# Patient Record
Sex: Female | Born: 1970 | Race: White | Hispanic: No | Marital: Married | Smoking: Never smoker
Health system: Southern US, Community
[De-identification: ages and names within clinical notes are randomized; demographics above are authoritative.]

## PROBLEM LIST (undated history)

## (undated) DIAGNOSIS — R112 Nausea with vomiting, unspecified: Secondary | ICD-10-CM

## (undated) DIAGNOSIS — Z9889 Other specified postprocedural states: Secondary | ICD-10-CM

## (undated) DIAGNOSIS — R7303 Prediabetes: Secondary | ICD-10-CM

## (undated) DIAGNOSIS — O24419 Gestational diabetes mellitus in pregnancy, unspecified control: Secondary | ICD-10-CM

## (undated) DIAGNOSIS — G4730 Sleep apnea, unspecified: Secondary | ICD-10-CM

## (undated) DIAGNOSIS — H8109 Meniere's disease, unspecified ear: Secondary | ICD-10-CM

## (undated) HISTORY — PX: CHOLECYSTECTOMY: SHX55

## (undated) HISTORY — DX: Prediabetes: R73.03

## (undated) HISTORY — PX: WISDOM TOOTH EXTRACTION: SHX21

## (undated) HISTORY — PX: UPPER GASTROINTESTINAL ENDOSCOPY: SHX188

## (undated) HISTORY — DX: Sleep apnea, unspecified: G47.30

## (undated) HISTORY — DX: Gestational diabetes mellitus in pregnancy, unspecified control: O24.419

---

## 1999-03-26 HISTORY — PX: BREAST EXCISIONAL BIOPSY: SUR124

## 1999-11-19 ENCOUNTER — Other Ambulatory Visit: Admission: RE | Admit: 1999-11-19 | Discharge: 1999-11-19 | Payer: Self-pay | Admitting: Gynecology

## 2000-10-28 ENCOUNTER — Other Ambulatory Visit: Admission: RE | Admit: 2000-10-28 | Discharge: 2000-10-28 | Payer: Self-pay | Admitting: Gynecology

## 2001-11-18 ENCOUNTER — Other Ambulatory Visit: Admission: RE | Admit: 2001-11-18 | Discharge: 2001-11-18 | Payer: Self-pay | Admitting: Obstetrics and Gynecology

## 2001-11-27 ENCOUNTER — Encounter: Admission: RE | Admit: 2001-11-27 | Discharge: 2001-11-27 | Payer: Self-pay | Admitting: Obstetrics and Gynecology

## 2001-11-27 ENCOUNTER — Encounter: Payer: Self-pay | Admitting: Obstetrics and Gynecology

## 2002-04-06 ENCOUNTER — Encounter: Admission: RE | Admit: 2002-04-06 | Discharge: 2002-04-06 | Payer: Self-pay | Admitting: Obstetrics and Gynecology

## 2002-05-17 ENCOUNTER — Inpatient Hospital Stay (HOSPITAL_COMMUNITY): Admission: AD | Admit: 2002-05-17 | Discharge: 2002-05-21 | Payer: Self-pay | Admitting: Obstetrics and Gynecology

## 2002-05-18 ENCOUNTER — Encounter (INDEPENDENT_AMBULATORY_CARE_PROVIDER_SITE_OTHER): Payer: Self-pay

## 2002-05-22 ENCOUNTER — Encounter: Admission: RE | Admit: 2002-05-22 | Discharge: 2002-06-21 | Payer: Self-pay | Admitting: Obstetrics and Gynecology

## 2002-06-28 ENCOUNTER — Other Ambulatory Visit: Admission: RE | Admit: 2002-06-28 | Discharge: 2002-06-28 | Payer: Self-pay | Admitting: Obstetrics and Gynecology

## 2003-07-19 ENCOUNTER — Other Ambulatory Visit: Admission: RE | Admit: 2003-07-19 | Discharge: 2003-07-19 | Payer: Self-pay | Admitting: Gynecology

## 2004-03-25 HISTORY — PX: CHOLECYSTECTOMY: SHX55

## 2004-07-19 ENCOUNTER — Other Ambulatory Visit: Admission: RE | Admit: 2004-07-19 | Discharge: 2004-07-19 | Payer: Self-pay | Admitting: Gynecology

## 2005-07-17 ENCOUNTER — Other Ambulatory Visit: Admission: RE | Admit: 2005-07-17 | Discharge: 2005-07-17 | Payer: Self-pay | Admitting: Gynecology

## 2006-03-25 HISTORY — PX: NASAL SEPTUM SURGERY: SHX37

## 2006-07-15 ENCOUNTER — Other Ambulatory Visit: Admission: RE | Admit: 2006-07-15 | Discharge: 2006-07-15 | Payer: Self-pay | Admitting: Gynecology

## 2007-07-16 ENCOUNTER — Other Ambulatory Visit: Admission: RE | Admit: 2007-07-16 | Discharge: 2007-07-16 | Payer: Self-pay | Admitting: Gynecology

## 2008-03-01 ENCOUNTER — Encounter: Admission: RE | Admit: 2008-03-01 | Discharge: 2008-03-01 | Payer: Self-pay | Admitting: Obstetrics and Gynecology

## 2008-03-25 ENCOUNTER — Inpatient Hospital Stay (HOSPITAL_COMMUNITY): Admission: AD | Admit: 2008-03-25 | Discharge: 2008-03-25 | Payer: Self-pay | Admitting: Obstetrics & Gynecology

## 2008-04-24 ENCOUNTER — Inpatient Hospital Stay (HOSPITAL_COMMUNITY): Admission: AD | Admit: 2008-04-24 | Discharge: 2008-04-27 | Payer: Self-pay | Admitting: Obstetrics and Gynecology

## 2008-04-30 ENCOUNTER — Inpatient Hospital Stay (HOSPITAL_COMMUNITY): Admission: AD | Admit: 2008-04-30 | Discharge: 2008-04-30 | Payer: Self-pay | Admitting: Obstetrics and Gynecology

## 2010-07-09 LAB — CBC
Hemoglobin: 11.9 g/dL — ABNORMAL LOW (ref 12.0–15.0)
Platelets: 172 10*3/uL (ref 150–400)
RDW: 18.5 % — ABNORMAL HIGH (ref 11.5–15.5)

## 2010-07-09 LAB — GLUCOSE, CAPILLARY: Glucose-Capillary: 91 mg/dL (ref 70–99)

## 2010-07-10 LAB — CBC
HCT: 33 % — ABNORMAL LOW (ref 36.0–46.0)
Hemoglobin: 11 g/dL — ABNORMAL LOW (ref 12.0–15.0)
Platelets: 133 10*3/uL — ABNORMAL LOW (ref 150–400)
WBC: 8.8 10*3/uL (ref 4.0–10.5)
WBC: 9.1 10*3/uL (ref 4.0–10.5)

## 2010-07-10 LAB — BLOOD GAS, ARTERIAL
Acid-base deficit: 2 mmol/L (ref 0.0–2.0)
Drawn by: 132
FIO2: 0.21 %
TCO2: 22.1 mmol/L (ref 0–100)
pCO2 arterial: 32.3 mmHg — ABNORMAL LOW (ref 35.0–45.0)

## 2010-07-10 LAB — COMPREHENSIVE METABOLIC PANEL
AST: 33 U/L (ref 0–37)
Albumin: 2.9 g/dL — ABNORMAL LOW (ref 3.5–5.2)
BUN: 15 mg/dL (ref 6–23)
Calcium: 8.8 mg/dL (ref 8.4–10.5)
Chloride: 110 mEq/L (ref 96–112)
Creatinine, Ser: 0.79 mg/dL (ref 0.4–1.2)
GFR calc Af Amer: >60 mL/min (ref >60)
GFR calc non Af Amer: >60 mL/min (ref >60)
Total Bilirubin: 0.4 mg/dL (ref 0.3–1.2)

## 2010-07-10 LAB — URIC ACID: Uric Acid, Serum: 5.4 mg/dL (ref 2.4–7.0)

## 2010-08-07 NOTE — Discharge Summary (Signed)
[name], [name]                ACCOUNT NO.:  [id number]   MEDICAL RECORD NO.:  [id number]          PATIENT TYPE:  INP   LOCATION:  9133                          [facility]ACILITY:  [facility]   PHYSICIAN:  Zelphia Cairo, MD    DATE OF BIRTH:  12/29/1970   DATE OF ADMISSION:  04/24/2008  DATE OF DISCHARGE:  04/27/2008                               DISCHARGE SUMMARY   ADMITTING DIAGNOSES:  1. Intrauterine pregnancy at 38-6/7th weeks' estimated gestational      age.  2. Spontaneous rupture of membranes.  3. Previous cesarean section.   DISCHARGE DIAGNOSES:  1. Status post low transverse cesarean section.  2. A viable female infant.   PROCEDURE:  Repeat low transverse cesarean section.   REASON FOR ADMISSION:  Please see written H and P.   HOSPITAL COURSE:  The patient is 40 year old, gravida 2, para 1, that  presented to [facility] at 38-6/7th weeks' estimated  gestational age with spontaneous onset of labor and spontaneous rupture  of membranes.  The patient had had a previous cesarean delivery and  desired repeat.  On admission, vital signs were stable.  Fetal heart  tones were reactive.  Contractions were noted to be every 3-5 minutes.  Cervix was examined and found to be 2 cm dilated with rupture of  membranes.  The patient was then transferred to the operating room where  spinal anesthesia was administered without difficulty.  A low transverse  incision was made with delivery of a viable female infant, weighing 8  pounds 4 ounces with Apgars of 9 at 1 minute and 9 at 5 minute.  The  patient tolerated the procedure well and was taken to the recovery room  in stable condition.  On postop day #1, the patient was without  complaint.  Vital signs were stable.  She was afebrile.  Abdomen was  soft.  Abdominal dressings noted to be clean, dry, and intact.  Laboratory findings revealed hemoglobin of 9.0 and platelet count  133,000.  On postoperative day #2, the patient was  without complaint.  Vital signs were stable.  She was afebrile.  Fundus was firm and  nontender.  Incision was clean, dry, and intact.  She is ambulating well  and tolerating a regular diet without complaints of nausea and vomiting.  Postoperative day #3, the patient was without complaint.  Vital signs  remained stable.  She was afebrile.  Fundus was firm and nontender.  Incision was clean, dry, and intact.  Staples removed and the patient  was later discharged home.   CONDITION ON DISCHARGE:  Stable.   DIET:  Regular as tolerated.   ACTIVITY:  No heavy lifting, no driving x2 weeks, and no vaginal entry.   FOLLOWUP:  The patient is to follow up in the office in 1-2 weeks for  incision check.  She is to call for temperature greater than 100  degrees, persistent nausea, vomiting, heavy vaginal bleeding and/or  redness, or drainage from the incisional site.   DISCHARGE MEDICATIONS:  1. Tylox #30, 1 p.o. every 4-6 hours p.r.n.  2. Motrin 600  mg every 6 hours.  3. Prenatal vitamins 1 p.o. daily.  4. Colace 100 p.o. daily p.r.n.      Julio Sicks, N.P.      Zelphia Cairo, MD  Electronically Signed    CC/MEDQ  D:  04/27/2008  T:  04/27/2008  Job:  [id number]

## 2010-08-07 NOTE — Op Note (Signed)
[name], [name]                ACCOUNT NO.:  [id number]   MEDICAL RECORD NO.:  [id number]          PATIENT TYPE:  INP   LOCATION:  9133                          [facility]   PHYSICIAN:  Michelle L. Grewal, M.D.DATE OF BIRTH:  02-26-1971   DATE OF PROCEDURE:  04/24/2008  DATE OF DISCHARGE:                               OPERATIVE REPORT   PREOPERATIVE DIAGNOSES:  Intrauterine pregnancy at 30-6/7, previous  cesarean section and labor.   POSTOPERATIVE DIAGNOSES:  Intrauterine pregnancy at 30-6/7, previous  cesarean section and labor.   PROCEDURE:  Repeat low transverse cesarean section.   SURGEON:  Michelle L. Vincente Poli, MD   ANESTHESIA:  Spinal.   Apgars 8 and 9, female infant delivered.   PROCEDURE IN DETAIL:  The patient was taken to the operating room.  She  was prepped and draped after spinal was placed.  Foley catheter was  inserted.  A low transverse incision was made, carried down to the  fascia.  Fascia was scored in the midline and extended laterally.  Rectus muscles were separated in the midline.  The peritoneum was  entered bluntly.  Peritoneal incision was then stretched.  The lower  uterine segment was identified.  The bladder flap was created sharply  and then digitally.  Bladder blade was then readjusted.  A low  transverse incision was made in the uterus.  Uterus was entered using a  hemostat.  The baby was then delivered in cephalic presentation quite  easily and was a female infant of Apgars 8 at 1 minute and 9 at 5  minutes.  The cord was clamped and cut.  The baby was handed to the  awaiting pediatrician.  Uterus was exteriorized.  It was cleared of all  clots and debris.  It was closed in 2 layers using 0 chromic in a  running locked stitch.  The uterus was returned to the abdomen.  Irrigation was performed.  Hemostasis was excellent.  The peritoneum and  rectus muscles were reapproximated using 0 Vicryl.  The fascia was  closed in a running stitch  using 0 Vicryl.  After irrigation of  subcutaneous layer, the skin was closed with staples.  All sponge, lap,  and instrument counts were correct x2.  The patient went to recovery  room in stable condition.      Michelle L. Vincente Poli, M.D.  Electronically Signed     MLG/MEDQ  D:  04/24/2008  T:  04/24/2008  Job:  [id number]

## 2010-08-10 NOTE — Discharge Summary (Signed)
NAMEKAMREN, HESKETT                          ACCOUNT NO.:  [id number]   MEDICAL RECORD NO.:  [id number]                   PATIENT TYPE:  INP   LOCATION:  9140                                 [facility]   PHYSICIAN:  Michelle L. Vincente Poli, M.D.            DATE OF BIRTH:  [birth year]   DATE OF ADMISSION:  05/17/2002  DATE OF DISCHARGE:  05/21/2002                                 DISCHARGE SUMMARY   ADMITTING DIAGNOSES:  1. Intrauterine pregnancy at 46 weeks estimated gestational age.  2. Induction of labor due to oligohydramnios.  3. Positive ANA titer.   DISCHARGE DIAGNOSES:  1. Status post low transverse cesarean section secondary to failure to     progress and chorioamnionitis.  2. Viable female infant.   PROCEDURE:  Primary low transverse cesarean section.   REASON FOR ADMISSION:  Please see dictated H&P.   HOSPITAL COURSE:  The patient is a 40 year old primigravida that was  admitted to [facility] for induction of labor due to  oligohydramnios.  The pregnancy had been complicated by oligohydramnios and  positive ANA titer.  The patient had been followed closely with serial  ultrasounds and the patient was taking a baby aspirin daily.  The patient  was admitted for Cytotec for cervical ripening followed by amniotomy and  Pitocin induction the following morning.  The patient did progress to 4-5 cm  dilated.  Despite adequate labor, the patient never progressed beyond 4-5  cm.  The patient also developed a fever consistent with chorioamnionitis.  IV antibiotics were started.  Decision was made to proceed with a cesarean  delivery.   The patient was taken to the operating room where epidural anesthesia was  dosed to an adequate level.  A low transverse incision was made with the  delivery of a viable female infant weighing 6 pounds 13 ounces with Apgars of  7 at one minute, 9 at five minutes.  Arterial cord pH was 7.26.  The patient  tolerated the procedure  well and was taken to the recovery room in stable  condition.   On postoperative day #1, vital signs were stable and the patient was  afebrile.  The patient had good return of bowel function.  Abdomen was soft.  Fundus was firm and nontender.  Abdominal dressing was noted to be clean,  dry, and intact.  Labs revealed hemoglobin 9.7; platelet count 148,000; wbc  count 15.6.  On postoperative day #2 temperature maximum was 100.5.  IV  antibiotics were continued.  Fundus was firm and nontender.  Abdominal  dressing was removed revealing an incision that was clean, dry, and intact.  CBC revealed hemoglobin of 8.7, platelet count 142,000; wbc count was down  to 8.3.  The patient was started on iron supplementation.  On postoperative  day #3 vital signs were stable.  The patient was afebrile x24 hours.  CBC  revealed hemoglobin of 8.2;  platelet count of 139,000; and wbc count down to  6.1.  Abdomen was soft, incision was clean, dry, and intact, staples  removed, and the patient was discharged home.   CONDITION ON DISCHARGE:  Good.   DIET:  Regular as tolerated.   ACTIVITY:  No heavy lifting, no vaginal entry, no driving x2 weeks.   FOLLOW-UP:  The patient is to follow up in the office in one to two weeks  for an incision check.  She is to call for temperature greater than 100  degrees, persistent nausea and vomiting, heaving vaginal bleeding, and/or  redness or drainage from the incisional site.   DISCHARGE MEDICATIONS:  1. Motrin 600 mg q.6h. p.r.n.  2. Tylox #30 one p.o. q.4-6h. p.r.n.  3. Prenatal vitamins one p.o. daily.  4. Colace one p.o. daily p.r.n.     Julio Sicks, N.P.                        Stann Mainland. Vincente Poli, M.D.    CC/MEDQ  D:  06/08/2002  T:  06/08/2002  Job:  [id number]

## 2010-08-10 NOTE — H&P (Signed)
   NAMEJANITA, CAMBEROS NO.:  [id number]   MEDICAL RECORD NO.:  [id number]                   PATIENT TYPE:   LOCATION:                                       FACILITY:   PHYSICIAN:  Tracie Harrier, M.D.              DATE OF BIRTH:   DATE OF ADMISSION:  05/17/2002  DATE OF DISCHARGE:                                HISTORY & PHYSICAL   HISTORY OF PRESENT ILLNESS:  The patient is a 40 year old female, gravida 1,  at 37-5/[redacted] weeks gestation.  The patient is admitted for induction of labor  due to oligohydramnios.  Her pregnancy was complicated by oligohydramnios  and a positive ANA (need for antibody titer).  She has been on baby aspirin  and has been followed very closely with serial ultrasounds.   Also, the patient was placed on an ADA diet for one elevated value during a  three-hour glucose tolerance test.  Her sugars have done very well during  her pregnancy on this ADA diet.  Her pregnancy has otherwise been  uneventful.  She is now admitted for induction.   OBSTETRICAL LABORATORY DATA:  Maternal blood type O+.  Rubella immune.  Elevated glucose tolerance test as mentioned in HPI.  Group B Streptococcus  negative.   PAST MEDICAL HISTORY:  None.   PAST SURGICAL HISTORY:  1. Wisdom teeth extraction.  2. Left breast biopsy, benign.   CURRENT MEDICATIONS:  1. Prenatal vitamins.  2. One baby aspirin per day.   ALLERGIES:  None known.   PHYSICAL EXAMINATION:  VITAL SIGNS:  Stable, blood pressure 118/78, fetal  heart tones 160s.  GENERAL APPEARANCE:  She is a well-developed, well-nourished female in no  acute distress.  HEENT:  Within normal limits.  NECK:  Supple without adenopathy or thyromegaly.  HEART:  Regular rate and rhythm without murmur, rub, or gallop.  LUNGS:  Clear to auscultation.  BREASTS:  Deferred.  ABDOMEN:  Gravid and nontender.  EXTREMITIES:  Grossly normal.  NEUROLOGIC:  Grossly normal.  DTRs 2+.  No clonus.  PELVIC:   Exam per Dineen Kid. Rana Snare, M.D., 1 cm, 50% effaced, vertex  presentation, and -3 station.   ADMISSION DIAGNOSES:  1. Intrauterine pregnancy at 37-5/7 weeks.  2. History of oligohydramnios.  3. History of positive antinuclear antibody.  4. Borderline gestational diabetes.   PLAN:  1. Serial induction of labor.  2. Attempt normal vaginal delivery.                                               Tracie Harrier, M.D.    REG/MEDQ  D:  05/17/2002  T:  05/17/2002  Job:  [id number]

## 2010-08-10 NOTE — Op Note (Signed)
[name], [name]                          ACCOUNT NO.:  [id number]   MEDICAL RECORD NO.:  [id number]                   PATIENT TYPE:  INP   LOCATION:  9140                                 [facility]ILITY[facility]   PHYSICIAN:  Dineen Kid. Rana Snare, M.D.                 DATE OF BIRTH:  May 14, 1970   DATE OF PROCEDURE:  05/17/2002  DATE OF DISCHARGE:                                 OPERATIVE REPORT   PREOPERATIVE DIAGNOSES:  1. Intrauterine pregnancy at 39 weeks.  2. Chorioamnionitis.  3. Failure to progress.   POSTOPERATIVE DIAGNOSES:  1. Intrauterine pregnancy at 39 weeks.  2. Chorioamnionitis.  3. Failure to progress.   PROCEDURE:  Primary low segment transverse cesarean section.   SURGEON:  Dineen Kid. Rana Snare, M.D.   ANESTHESIA:  Epidural.   ESTIMATED BLOOD LOSS:  1000 mL.   INDICATIONS:  The patient is a 40 year old G1 at 47 weeks.  She presents for  induction of labor due to oligohydramnios.  Throughout the pregnancy she  discovered she was positive ANA.  She has been on baby aspirin.  She had  closed surveillance with serial ultrasounds.  She had resolution of the  oligohydramnios to a low normal fluid level.  Because she was term, planned  induction of labor.  She was admitted for Cytotec cervical ripening followed  by amniotomy and induction the following morning.  She progressed to 4-5 cm  and despite adequate labor did not progress beyond 4-5 cm.  She also  developed fever consistent with chorioamnionitis and was begun on ampicillin  and gentamicin.  Because of failure to progress and amnionitis, decision was  made to proceed with primary low cervical transverse cesarean section.  Risks and benefits were discussed.  Informed consent was obtained.  The  findings at the time of surgery were a viable female infant in the  occipitoposterior presentation.  Apgars were 7 and 9.  Cord ph arterial is  current pending.  The weight is 6 pounds 13 ounces.   DESCRIPTION OF PROCEDURE:  After  adequate analgesia, the patient was placed  in the supine position with left lateral tilt.  She was sterilely prepped  and draped.  Bladder was sterilely drained with a Foley catheter.  A  Pfannenstiel skin incision was made two fingerbreadths above the pubic  symphysis taken down sharply to the fascia, incised transversely and  extended superiorly and inferiorly.  The bellies of the rectus muscle were  separated sharply in the midline.  The peritoneum was then entered sharply.  The bladder flap was created and was placed behind the bladder blade.  A low  segment myotomy incision was made down to the infant's vertex.  The infant's  vertex was delivered atraumatically.  The nares and pharynx were suctioned.  The infant was then delivered.  The cord was clamped, cut and the infant was  handed to the pediatrician for  resuscitation.  Cord blood was then obtained.  The placenta was then extracted manually.  The uterus was then exteriorized,  wiped clean with a dry lap.  The myotomy incision was closed in two layers,  the first being a running locking layer and the second being an imbricating  layer of 0 Monocryl suture.  Hemostasis was achieved with a figure-of-eight  of 0 Monocryl.  After adequate hemostasis was achieved, the uterus was  placed back into the peritoneal cavity.  A small anterior fundal fibroid was  noted in the subserosal position, approximately 1 cm in size.  Normal tubes  and ovaries were also noted.  After a copious amount of irrigation and  adequate hemostasis, the peritoneum was closed with 0 Monocryl.  Rectus  muscle was plicated in the midline.  Irrigation was applied and after  adequate hemostasis, the fascia was closed with #1 Vicryl in a running  fashion.  Irrigation was once again applied.  After adequate hemostasis the  skin was stapled and Steri-Strips applied.  The patient tolerated the  procedure well and was stable on transfer to the recovery room.  Sponge and   instrument count was correct x3.  Estimated blood loss was 1000 mL.  The  patient received Ampicillin and gentamicin 2-1/2 hours prior to the surgery  for chorioamnionitis.  We will begin her on gentamicin and clindamycin after  the surgery and continue for 24 hours.                                               Dineen Kid Rana Snare, M.D.    DCL/MEDQ  D:  05/18/2002  T:  05/19/2002  Job:  [id number]

## 2012-01-20 DIAGNOSIS — G43109 Migraine with aura, not intractable, without status migrainosus: Secondary | ICD-10-CM | POA: Insufficient documentation

## 2012-01-20 DIAGNOSIS — F419 Anxiety disorder, unspecified: Secondary | ICD-10-CM

## 2012-01-20 DIAGNOSIS — H9319 Tinnitus, unspecified ear: Secondary | ICD-10-CM

## 2012-01-20 DIAGNOSIS — H8109 Meniere's disease, unspecified ear: Secondary | ICD-10-CM | POA: Insufficient documentation

## 2012-01-20 DIAGNOSIS — R42 Dizziness and giddiness: Secondary | ICD-10-CM

## 2012-01-20 HISTORY — DX: Dizziness and giddiness: R42

## 2012-01-20 HISTORY — DX: Tinnitus, unspecified ear: H93.19

## 2012-01-20 HISTORY — DX: Anxiety disorder, unspecified: F41.9

## 2012-11-06 DIAGNOSIS — R7303 Prediabetes: Secondary | ICD-10-CM | POA: Insufficient documentation

## 2014-10-14 ENCOUNTER — Ambulatory Visit (HOSPITAL_COMMUNITY)
Admission: RE | Admit: 2014-10-14 | Discharge: 2014-10-14 | Disposition: A | Discharge status: Home / Self Care | Payer: BLUE CROSS/BLUE SHIELD | Source: Ambulatory Visit | Attending: Obstetrics and Gynecology | Admitting: Obstetrics and Gynecology

## 2014-10-14 ENCOUNTER — Encounter (HOSPITAL_COMMUNITY): Payer: Self-pay

## 2014-10-14 ENCOUNTER — Ambulatory Visit (HOSPITAL_COMMUNITY): Payer: BLUE CROSS/BLUE SHIELD

## 2014-10-14 ENCOUNTER — Encounter (HOSPITAL_COMMUNITY)
Admission: RE | Disposition: A | Discharge status: Home / Self Care | Payer: Self-pay | Source: Ambulatory Visit | Attending: Obstetrics and Gynecology

## 2014-10-14 DIAGNOSIS — O99211 Obesity complicating pregnancy, first trimester: Secondary | ICD-10-CM | POA: Diagnosis not present

## 2014-10-14 DIAGNOSIS — Z3A01 Less than 8 weeks gestation of pregnancy: Secondary | ICD-10-CM | POA: Insufficient documentation

## 2014-10-14 DIAGNOSIS — Z332 Encounter for elective termination of pregnancy: Secondary | ICD-10-CM | POA: Insufficient documentation

## 2014-10-14 DIAGNOSIS — Z91013 Allergy to seafood: Secondary | ICD-10-CM | POA: Diagnosis not present

## 2014-10-14 DIAGNOSIS — O30011 Twin pregnancy, monochorionic/monoamniotic, first trimester: Secondary | ICD-10-CM | POA: Insufficient documentation

## 2014-10-14 DIAGNOSIS — Z6841 Body Mass Index (BMI) 40.0 and over, adult: Secondary | ICD-10-CM | POA: Diagnosis not present

## 2014-10-14 DIAGNOSIS — Z9049 Acquired absence of other specified parts of digestive tract: Secondary | ICD-10-CM | POA: Diagnosis not present

## 2014-10-14 DIAGNOSIS — O09521 Supervision of elderly multigravida, first trimester: Secondary | ICD-10-CM | POA: Diagnosis not present

## 2014-10-14 HISTORY — DX: Other specified postprocedural states: Z98.890

## 2014-10-14 HISTORY — DX: Other specified postprocedural states: R11.2

## 2014-10-14 HISTORY — PX: DILATION AND EVACUATION: SHX1459

## 2014-10-14 LAB — CBC
HCT: 39.9 % (ref 36.0–46.0)
HEMOGLOBIN: 13.5 g/dL (ref 12.0–15.0)
MCH: 27.4 pg (ref 26.0–34.0)
MCHC: 33.8 g/dL (ref 30.0–36.0)
MCV: 80.9 fL (ref 78.0–100.0)
PLATELETS: 234 10*3/uL (ref 150–400)
RBC: 4.93 MIL/uL (ref 3.87–5.11)
RDW: 14.7 % (ref 11.5–15.5)
WBC: 8.6 10*3/uL (ref 4.0–10.5)

## 2014-10-14 LAB — TYPE AND SCREEN
ABO/RH(D): O POS
Antibody Screen: NEGATIVE

## 2014-10-14 LAB — ABO/RH: ABO/RH(D): O POS

## 2014-10-14 SURGERY — DILATION AND EVACUATION, UTERUS
Anesthesia: General

## 2014-10-14 MED ORDER — MIDAZOLAM HCL 2 MG/2ML IJ SOLN
INTRAMUSCULAR | Status: AC
  Filled 2014-10-14: qty 2

## 2014-10-14 MED ORDER — ONDANSETRON HCL 4 MG/2ML IJ SOLN: 4.0000 mg | Freq: Once | INTRAMUSCULAR | Status: DC | PRN

## 2014-10-14 MED ORDER — LIDOCAINE HCL (CARDIAC) 20 MG/ML IV SOLN
INTRAVENOUS | Status: DC | PRN
  Administered 2014-10-14: 30 mg via INTRAVENOUS

## 2014-10-14 MED ORDER — SCOPOLAMINE 1 MG/3DAYS TD PT72
1.0000 | MEDICATED_PATCH | Freq: Once | TRANSDERMAL | Status: DC
  Administered 2014-10-14: 1.5 mg via TRANSDERMAL

## 2014-10-14 MED ORDER — FENTANYL CITRATE (PF) 100 MCG/2ML IJ SOLN: 25.0000 ug | INTRAMUSCULAR | Status: DC | PRN

## 2014-10-14 MED ORDER — METHYLERGONOVINE MALEATE 0.2 MG/ML IJ SOLN
INTRAMUSCULAR | Status: AC
  Filled 2014-10-14: qty 1

## 2014-10-14 MED ORDER — LACTATED RINGERS IV SOLN
INTRAVENOUS | Status: DC
  Administered 2014-10-14 (×2): via INTRAVENOUS

## 2014-10-14 MED ORDER — FENTANYL CITRATE (PF) 100 MCG/2ML IJ SOLN
INTRAMUSCULAR | Status: AC
  Filled 2014-10-14: qty 2

## 2014-10-14 MED ORDER — FENTANYL CITRATE (PF) 100 MCG/2ML IJ SOLN
INTRAMUSCULAR | Status: DC | PRN
  Administered 2014-10-14: 50 ug via INTRAVENOUS
  Administered 2014-10-14: 100 ug via INTRAVENOUS
  Administered 2014-10-14: 50 ug via INTRAVENOUS

## 2014-10-14 MED ORDER — MEPERIDINE HCL 25 MG/ML IJ SOLN: 6.2500 mg | INTRAMUSCULAR | Status: DC | PRN

## 2014-10-14 MED ORDER — DEXAMETHASONE SODIUM PHOSPHATE 10 MG/ML IJ SOLN
INTRAMUSCULAR | Status: DC | PRN
  Administered 2014-10-14: 4 mg via INTRAVENOUS

## 2014-10-14 MED ORDER — PROPOFOL 10 MG/ML IV BOLUS
INTRAVENOUS | Status: AC
  Filled 2014-10-14: qty 20

## 2014-10-14 MED ORDER — LIDOCAINE-EPINEPHRINE 1 %-1:100000 IJ SOLN
INTRAMUSCULAR | Status: AC
  Filled 2014-10-14: qty 1

## 2014-10-14 MED ORDER — PROPOFOL 500 MG/50ML IV EMUL
INTRAVENOUS | Status: DC | PRN
  Administered 2014-10-14: 200 mg via INTRAVENOUS

## 2014-10-14 MED ORDER — MIDAZOLAM HCL 2 MG/2ML IJ SOLN
INTRAMUSCULAR | Status: DC | PRN
  Administered 2014-10-14: 2 mg via INTRAVENOUS

## 2014-10-14 MED ORDER — KETOROLAC TROMETHAMINE 30 MG/ML IJ SOLN
INTRAMUSCULAR | Status: AC
  Filled 2014-10-14: qty 1

## 2014-10-14 MED ORDER — METHYLERGONOVINE MALEATE 0.2 MG/ML IJ SOLN
INTRAMUSCULAR | Status: DC | PRN
  Administered 2014-10-14: 0.2 mg via INTRAMUSCULAR

## 2014-10-14 MED ORDER — SCOPOLAMINE 1 MG/3DAYS TD PT72
MEDICATED_PATCH | TRANSDERMAL | Status: AC
  Administered 2014-10-14: 1.5 mg via TRANSDERMAL
  Filled 2014-10-14: qty 1

## 2014-10-14 MED ORDER — KETOROLAC TROMETHAMINE 30 MG/ML IJ SOLN
INTRAMUSCULAR | Status: DC | PRN
  Administered 2014-10-14: 30 mg via INTRAVENOUS

## 2014-10-14 MED ORDER — KETOROLAC TROMETHAMINE 30 MG/ML IJ SOLN: 30.0000 mg | Freq: Once | INTRAMUSCULAR | Status: DC | PRN

## 2014-10-14 MED ORDER — ONDANSETRON HCL 4 MG/2ML IJ SOLN
INTRAMUSCULAR | Status: DC | PRN
  Administered 2014-10-14: 4 mg via INTRAVENOUS

## 2014-10-14 MED ORDER — ALPRAZOLAM 0.5 MG PO TABS: 0.5000 mg | ORAL_TABLET | Freq: Three times a day (TID) | ORAL | Status: DC | PRN

## 2014-10-14 MED ORDER — DEXAMETHASONE SODIUM PHOSPHATE 4 MG/ML IJ SOLN
INTRAMUSCULAR | Status: AC
  Filled 2014-10-14: qty 1

## 2014-10-14 MED ORDER — ONDANSETRON HCL 4 MG/2ML IJ SOLN
INTRAMUSCULAR | Status: AC
  Filled 2014-10-14: qty 2

## 2014-10-14 MED ORDER — 0.9 % SODIUM CHLORIDE (POUR BTL) OPTIME
TOPICAL | Status: DC | PRN
  Administered 2014-10-14: 1000 mL

## 2014-10-14 MED ORDER — LIDOCAINE HCL (CARDIAC) 20 MG/ML IV SOLN
INTRAVENOUS | Status: AC
  Filled 2014-10-14: qty 5

## 2014-10-14 MED ORDER — CEFOTETAN DISODIUM 2 G IJ SOLR
2.0000 g | INTRAMUSCULAR | Status: AC
  Administered 2014-10-14: 2 g via INTRAVENOUS
  Filled 2014-10-14: qty 2

## 2014-10-14 SURGICAL SUPPLY — 17 items
CATH ROBINSON RED A/P 16FR (CATHETERS) ×2 IMPLANT
CLOTH BEACON ORANGE TIMEOUT ST (SAFETY) ×2 IMPLANT
DECANTER SPIKE VIAL GLASS SM (MISCELLANEOUS) ×2 IMPLANT
GLOVE BIO SURGEON STRL SZ8 (GLOVE) ×2 IMPLANT
GLOVE SURG ORTHO 8.0 STRL STRW (GLOVE) ×2 IMPLANT
GOWN STRL REUS W/TWL LRG LVL3 (GOWN DISPOSABLE) ×4 IMPLANT
KIT BERKELEY 1ST TRIMESTER 3/8 (MISCELLANEOUS) ×2 IMPLANT
NS IRRIG 1000ML POUR BTL (IV SOLUTION) ×2 IMPLANT
PACK VAGINAL MINOR WOMEN LF (CUSTOM PROCEDURE TRAY) ×2 IMPLANT
PAD OB MATERNITY 4.3X12.25 (PERSONAL CARE ITEMS) ×2 IMPLANT
PAD PREP 24X48 CUFFED NSTRL (MISCELLANEOUS) ×2 IMPLANT
SET BERKELEY SUCTION TUBING (SUCTIONS) ×2 IMPLANT
TOWEL OR 17X24 6PK STRL BLUE (TOWEL DISPOSABLE) ×4 IMPLANT
VACURETTE 10 RIGID CVD (CANNULA) IMPLANT
VACURETTE 7MM CVD STRL WRAP (CANNULA) IMPLANT
VACURETTE 8 RIGID CVD (CANNULA) ×1 IMPLANT
VACURETTE 9 RIGID CVD (CANNULA) IMPLANT

## 2014-10-14 NOTE — Brief Op Note (Signed)
10/14/2014  4:38 PM  PATIENT:  Mila Merry  44 y.o. female  PRE-OPERATIVE DIAGNOSIS:  elective /twins  POST-OPERATIVE DIAGNOSIS:  elective /twins  PROCEDURE:  Procedure(s): DILATATION AND EVACUATION (N/A)  SURGEON:  Surgeon(s) and Role:    * Candice Camp, MD - Primary  PHYSICIAN ASSISTANT:   ASSISTANTS: none   ANESTHESIA:   general  EBL:  Total I/O In: -  Out: 100 [Urine:100]  BLOOD ADMINISTERED:none  DRAINS: none   LOCAL MEDICATIONS USED:  NONE  SPECIMEN:  Source of Specimen:  POC  DISPOSITION OF SPECIMEN:  PATHOLOGY  COUNTS:  YES  TOURNIQUET:  * No tourniquets in log *  DICTATION: .Other Dictation: Dictation Number 1  PLAN OF CARE: Discharge to home after PACU  PATIENT DISPOSITION:  PACU - hemodynamically stable.   Delay start of Pharmacological VTE agent (>24hrs) due to surgical blood loss or risk of bleeding: no

## 2014-10-14 NOTE — Anesthesia Preprocedure Evaluation (Signed)
Anesthesia Evaluation  Patient identified by MRN, date of birth, ID band Patient awake    Reviewed: Allergy & Precautions, H&P , NPO status , Patient's Chart, lab work & pertinent test results  History of Anesthesia Complications (+) PONV  Airway Mallampati: II  TM Distance: >3 FB Neck ROM: full    Dental no notable dental hx.    Pulmonary neg pulmonary ROS,    Pulmonary exam normal       Cardiovascular negative cardio ROS Normal cardiovascular exam    Neuro/Psych negative neurological ROS  negative psych ROS   GI/Hepatic negative GI ROS, Neg liver ROS,   Endo/Other  Morbid obesity  Renal/GU negative Renal ROS     Musculoskeletal   Abdominal (+) + obese,   Peds  Hematology negative hematology ROS (+)   Anesthesia Other Findings   Reproductive/Obstetrics (+) Pregnancy                             Anesthesia Physical Anesthesia Plan  ASA: III  Anesthesia Plan: General   Post-op Pain Management:    Induction: Intravenous  Airway Management Planned: LMA  Additional Equipment:   Intra-op Plan:   Post-operative Plan:   Informed Consent: I have reviewed the patients History and Physical, chart, labs and discussed the procedure including the risks, benefits and alternatives for the proposed anesthesia with the patient or authorized representative who has indicated his/her understanding and acceptance.     Plan Discussed with: CRNA and Surgeon  Anesthesia Plan Comments:         Anesthesia Quick Evaluation

## 2014-10-14 NOTE — Transfer of Care (Signed)
Immediate Anesthesia Transfer of Care Note  Patient: [name]  Procedure(s) Performed: Procedure(s): DILATATION AND EVACUATION (N/A)  Patient Location: PACU  Anesthesia Type:General  Level of Consciousness: awake, alert , oriented and patient cooperative  Airway & Oxygen Therapy: Patient Spontanous Breathing  Post-op Assessment: Report given to RN and Post -op Vital signs reviewed and stable  Post vital signs: Reviewed and stable  Last Vitals:  Filed Vitals:   10/14/14 1648  BP:   Pulse:   Temp: 36.9 C  Resp:     Complications: No apparent anesthesia complications

## 2014-10-14 NOTE — Anesthesia Postprocedure Evaluation (Signed)
Anesthesia Post Note  Patient: [name]  Procedure(s) Performed: Procedure(s) (LRB): DILATATION AND EVACUATION (N/A)  Anesthesia type: General  Patient location: PACU  Post pain: Pain level controlled  Post assessment: Post-op Vital signs reviewed  Last Vitals:  Filed Vitals:   10/14/14 1648  BP:   Pulse:   Temp: 36.9 C  Resp:     Post vital signs: Reviewed  Level of consciousness: sedated  Complications: No apparent anesthesia complications

## 2014-10-14 NOTE — H&P (Addendum)
  44 yo with history of difficult pregnancys, and now surprise preg after husband had been seen by urologist, told he was sterile on several occasions and was placed on testosterone.Marland Kitchen  He is the father of this pregnancy.  Apparently, was not sterile..  Had US showing viable twins 5 days ago.  Patient and her husband have been counseled re: options and at this time elect to terminate the pregnancy. They have thought long and hard and reached this very difficult decision.  O+ blood type  PSH  C/S, Gallbladder, nasal surgery PMH HAs,  Gest DM  Meds Diclegis NKDA  Uterus AV 10weeks size   IMP PLAN  Desires VIP R&B discussed at length,  Informed consent obtained. DL

## 2014-10-15 NOTE — Op Note (Signed)
[name], [name] NO.:  [id number]  MEDICAL RECORD NO.:  [id number]  LOCATION:  [facility]                          [facility]  PHYSICIAN:  Dineen Kid. Rana Snare, M.D.    DATE OF BIRTH:  03/31/1970  DATE OF PROCEDURE:  10/14/2014 DATE OF DISCHARGE:  10/14/2014                              OPERATIVE REPORT   PREOPERATIVE DIAGNOSIS:  Twin pregnancy, 7-1/2 weeks' gestational age. Desires interruption of pregnancy.  POSTOPERATIVE DIAGNOSIS:  Twin pregnancy, 7-1/2 weeks' gestational age. Desires interruption of pregnancy.  PROCEDURE:  Dilation and evacuation.  SURGEON:  Dineen Kid. Rana Snare, M.D.  ANESTHESIA:  General by LMA.  INDICATIONS:  [name] is a 44 year old with pregnancy that ended up in a surprise pregnancy.  Apparently, her husband was determined to be sterile and was placed on testosterone supplements and apparently she conceived and subsequently found out that he was not sterile.  [name] has had several pregnancies that ended up being very high risk including problems with oligohydramnios, hyperemesis, gestational diabetes, and now she is 44 years old and this turns out to be monochorionic twins. Due to risk factors and social situations, we counseled her and her husband at length regarding different options.  Had her last ultrasound about 5 days ago and after crying, praying, and thinking about this week, they have come to the decision to interrupt the pregnancy and present for dilation and evacuation.  Risks and benefits of procedure were discussed at length.  Informed consent was obtained.  Her blood type is O positive.  DESCRIPTION OF PROCEDURE:  After adequate analgesia, the patient was placed in the dorsal lithotomy position.  She is sterilely prepped and draped.  Bladder was sterilely drained.  Graves speculum was placed. Tenaculum was placed at the edge of the cervix.  Uterus sounded to 10 cm, easily dilated to a #27 Pratt dilator.  An 8 mm suction  curette was inserted.  Suction curettage was performed, retrieving products of conception.  Suction curettage was performed until a gritty surface was felt throughout the endometrial cavity and no residual products felt. The patient was given Methergine 0.2 mg IM with good uterine response. The curette was then removed.  Tenaculum removed from the cervix and noted to be hemostatic.  The patient was then transferred to recovery room in stable condition.  Sponge and instrument count was normal x3. Estimated blood loss was minimal.  The patient received 2 g of cefotetan preoperatively and Toradol 30 mg IV postoperatively.  DISPOSITION:  The patient will be discharged home and will follow up in the office in 2-3 weeks.  Sent home with a routine instruction sheet for dilation and evacuation in 3 weeks.  When she returns, will be placed in the Mirena IUD.     Dineen Kid Rana Snare, M.D.     DCL/MEDQ  D:  10/14/2014  T:  10/14/2014  Job:  [id number]

## 2014-10-17 ENCOUNTER — Encounter (HOSPITAL_COMMUNITY): Payer: Self-pay | Admitting: Obstetrics and Gynecology

## 2015-08-14 DIAGNOSIS — J3089 Other allergic rhinitis: Secondary | ICD-10-CM | POA: Diagnosis not present

## 2015-08-29 DIAGNOSIS — H8101 Meniere's disease, right ear: Secondary | ICD-10-CM | POA: Diagnosis not present

## 2015-08-29 DIAGNOSIS — M26622 Arthralgia of left temporomandibular joint: Secondary | ICD-10-CM | POA: Insufficient documentation

## 2015-08-29 DIAGNOSIS — H9311 Tinnitus, right ear: Secondary | ICD-10-CM | POA: Diagnosis not present

## 2015-08-29 DIAGNOSIS — R7303 Prediabetes: Secondary | ICD-10-CM | POA: Diagnosis not present

## 2015-09-27 DIAGNOSIS — R499 Unspecified voice and resonance disorder: Secondary | ICD-10-CM | POA: Diagnosis not present

## 2015-09-27 DIAGNOSIS — R5383 Other fatigue: Secondary | ICD-10-CM | POA: Diagnosis not present

## 2015-09-27 DIAGNOSIS — Z01411 Encounter for gynecological examination (general) (routine) with abnormal findings: Secondary | ICD-10-CM | POA: Diagnosis not present

## 2015-09-27 DIAGNOSIS — Z6841 Body Mass Index (BMI) 40.0 and over, adult: Secondary | ICD-10-CM | POA: Diagnosis not present

## 2015-09-27 DIAGNOSIS — Z0000 Encounter for general adult medical examination without abnormal findings: Secondary | ICD-10-CM | POA: Diagnosis not present

## 2015-10-20 DIAGNOSIS — R5381 Other malaise: Secondary | ICD-10-CM | POA: Diagnosis not present

## 2015-10-20 DIAGNOSIS — Z6841 Body Mass Index (BMI) 40.0 and over, adult: Secondary | ICD-10-CM | POA: Diagnosis not present

## 2015-10-24 DIAGNOSIS — G4730 Sleep apnea, unspecified: Secondary | ICD-10-CM | POA: Diagnosis not present

## 2015-10-26 DIAGNOSIS — G4730 Sleep apnea, unspecified: Secondary | ICD-10-CM | POA: Diagnosis not present

## 2015-10-27 DIAGNOSIS — G4730 Sleep apnea, unspecified: Secondary | ICD-10-CM | POA: Diagnosis not present

## 2015-11-08 DIAGNOSIS — G4733 Obstructive sleep apnea (adult) (pediatric): Secondary | ICD-10-CM | POA: Diagnosis not present

## 2015-11-09 DIAGNOSIS — N390 Urinary tract infection, site not specified: Secondary | ICD-10-CM | POA: Diagnosis not present

## 2015-11-09 DIAGNOSIS — N760 Acute vaginitis: Secondary | ICD-10-CM | POA: Diagnosis not present

## 2015-11-21 DIAGNOSIS — Z6841 Body Mass Index (BMI) 40.0 and over, adult: Secondary | ICD-10-CM | POA: Diagnosis not present

## 2015-11-21 DIAGNOSIS — R490 Dysphonia: Secondary | ICD-10-CM | POA: Diagnosis not present

## 2015-11-21 DIAGNOSIS — K219 Gastro-esophageal reflux disease without esophagitis: Secondary | ICD-10-CM | POA: Diagnosis not present

## 2015-11-21 DIAGNOSIS — R5383 Other fatigue: Secondary | ICD-10-CM | POA: Diagnosis not present

## 2015-11-21 DIAGNOSIS — F331 Major depressive disorder, recurrent, moderate: Secondary | ICD-10-CM | POA: Diagnosis not present

## 2015-12-08 DIAGNOSIS — K219 Gastro-esophageal reflux disease without esophagitis: Secondary | ICD-10-CM | POA: Diagnosis not present

## 2015-12-08 DIAGNOSIS — K317 Polyp of stomach and duodenum: Secondary | ICD-10-CM | POA: Diagnosis not present

## 2015-12-08 DIAGNOSIS — K2900 Acute gastritis without bleeding: Secondary | ICD-10-CM | POA: Diagnosis not present

## 2015-12-08 DIAGNOSIS — R490 Dysphonia: Secondary | ICD-10-CM | POA: Diagnosis not present

## 2015-12-08 DIAGNOSIS — D131 Benign neoplasm of stomach: Secondary | ICD-10-CM | POA: Diagnosis not present

## 2015-12-15 DIAGNOSIS — G4733 Obstructive sleep apnea (adult) (pediatric): Secondary | ICD-10-CM | POA: Diagnosis not present

## 2016-01-14 DIAGNOSIS — G4733 Obstructive sleep apnea (adult) (pediatric): Secondary | ICD-10-CM | POA: Diagnosis not present

## 2016-01-29 DIAGNOSIS — Z1231 Encounter for screening mammogram for malignant neoplasm of breast: Secondary | ICD-10-CM | POA: Diagnosis not present

## 2016-01-29 DIAGNOSIS — Z01419 Encounter for gynecological examination (general) (routine) without abnormal findings: Secondary | ICD-10-CM | POA: Diagnosis not present

## 2016-01-29 DIAGNOSIS — Z6841 Body Mass Index (BMI) 40.0 and over, adult: Secondary | ICD-10-CM | POA: Diagnosis not present

## 2016-02-14 DIAGNOSIS — G4733 Obstructive sleep apnea (adult) (pediatric): Secondary | ICD-10-CM | POA: Diagnosis not present

## 2016-03-15 DIAGNOSIS — G4733 Obstructive sleep apnea (adult) (pediatric): Secondary | ICD-10-CM | POA: Diagnosis not present

## 2016-04-15 DIAGNOSIS — G4733 Obstructive sleep apnea (adult) (pediatric): Secondary | ICD-10-CM | POA: Diagnosis not present

## 2016-05-16 DIAGNOSIS — G4733 Obstructive sleep apnea (adult) (pediatric): Secondary | ICD-10-CM | POA: Diagnosis not present

## 2016-06-13 DIAGNOSIS — G4733 Obstructive sleep apnea (adult) (pediatric): Secondary | ICD-10-CM | POA: Diagnosis not present

## 2016-09-30 DIAGNOSIS — K922 Gastrointestinal hemorrhage, unspecified: Secondary | ICD-10-CM | POA: Diagnosis not present

## 2016-09-30 DIAGNOSIS — R197 Diarrhea, unspecified: Secondary | ICD-10-CM | POA: Diagnosis not present

## 2016-10-04 DIAGNOSIS — G4733 Obstructive sleep apnea (adult) (pediatric): Secondary | ICD-10-CM | POA: Diagnosis not present

## 2016-10-15 DIAGNOSIS — R070 Pain in throat: Secondary | ICD-10-CM | POA: Diagnosis not present

## 2016-10-15 DIAGNOSIS — A78 Q fever: Secondary | ICD-10-CM | POA: Diagnosis not present

## 2016-10-15 DIAGNOSIS — R5383 Other fatigue: Secondary | ICD-10-CM | POA: Diagnosis not present

## 2016-10-15 DIAGNOSIS — J309 Allergic rhinitis, unspecified: Secondary | ICD-10-CM | POA: Diagnosis not present

## 2016-10-22 DIAGNOSIS — J029 Acute pharyngitis, unspecified: Secondary | ICD-10-CM | POA: Diagnosis not present

## 2016-10-22 DIAGNOSIS — J0100 Acute maxillary sinusitis, unspecified: Secondary | ICD-10-CM | POA: Diagnosis not present

## 2016-11-29 DIAGNOSIS — D485 Neoplasm of uncertain behavior of skin: Secondary | ICD-10-CM | POA: Diagnosis not present

## 2016-11-29 DIAGNOSIS — D225 Melanocytic nevi of trunk: Secondary | ICD-10-CM | POA: Diagnosis not present

## 2016-11-29 DIAGNOSIS — D2239 Melanocytic nevi of other parts of face: Secondary | ICD-10-CM | POA: Diagnosis not present

## 2016-11-29 DIAGNOSIS — L814 Other melanin hyperpigmentation: Secondary | ICD-10-CM | POA: Diagnosis not present

## 2016-11-29 DIAGNOSIS — L719 Rosacea, unspecified: Secondary | ICD-10-CM | POA: Diagnosis not present

## 2017-01-03 DIAGNOSIS — D485 Neoplasm of uncertain behavior of skin: Secondary | ICD-10-CM | POA: Diagnosis not present

## 2017-01-09 DIAGNOSIS — Z23 Encounter for immunization: Secondary | ICD-10-CM | POA: Diagnosis not present

## 2017-01-09 DIAGNOSIS — R0602 Shortness of breath: Secondary | ICD-10-CM | POA: Diagnosis not present

## 2017-01-09 DIAGNOSIS — Z6841 Body Mass Index (BMI) 40.0 and over, adult: Secondary | ICD-10-CM | POA: Diagnosis not present

## 2017-01-09 DIAGNOSIS — Z1389 Encounter for screening for other disorder: Secondary | ICD-10-CM | POA: Diagnosis not present

## 2017-01-13 DIAGNOSIS — G4733 Obstructive sleep apnea (adult) (pediatric): Secondary | ICD-10-CM | POA: Diagnosis not present

## 2017-02-20 DIAGNOSIS — Z01419 Encounter for gynecological examination (general) (routine) without abnormal findings: Secondary | ICD-10-CM | POA: Diagnosis not present

## 2017-02-20 DIAGNOSIS — Z6841 Body Mass Index (BMI) 40.0 and over, adult: Secondary | ICD-10-CM | POA: Diagnosis not present

## 2017-02-20 DIAGNOSIS — Z1231 Encounter for screening mammogram for malignant neoplasm of breast: Secondary | ICD-10-CM | POA: Diagnosis not present

## 2017-04-17 DIAGNOSIS — G4733 Obstructive sleep apnea (adult) (pediatric): Secondary | ICD-10-CM | POA: Diagnosis not present

## 2017-04-24 DIAGNOSIS — D485 Neoplasm of uncertain behavior of skin: Secondary | ICD-10-CM | POA: Diagnosis not present

## 2017-04-24 DIAGNOSIS — L821 Other seborrheic keratosis: Secondary | ICD-10-CM | POA: Diagnosis not present

## 2017-05-23 DIAGNOSIS — R05 Cough: Secondary | ICD-10-CM | POA: Diagnosis not present

## 2017-05-29 DIAGNOSIS — J209 Acute bronchitis, unspecified: Secondary | ICD-10-CM | POA: Diagnosis not present

## 2017-06-05 DIAGNOSIS — J209 Acute bronchitis, unspecified: Secondary | ICD-10-CM | POA: Diagnosis not present

## 2017-07-22 DIAGNOSIS — G4733 Obstructive sleep apnea (adult) (pediatric): Secondary | ICD-10-CM | POA: Diagnosis not present

## 2017-08-27 DIAGNOSIS — F320 Major depressive disorder, single episode, mild: Secondary | ICD-10-CM | POA: Diagnosis not present

## 2017-08-27 DIAGNOSIS — J069 Acute upper respiratory infection, unspecified: Secondary | ICD-10-CM | POA: Diagnosis not present

## 2017-09-15 DIAGNOSIS — G43109 Migraine with aura, not intractable, without status migrainosus: Secondary | ICD-10-CM | POA: Diagnosis not present

## 2017-09-15 DIAGNOSIS — R42 Dizziness and giddiness: Secondary | ICD-10-CM | POA: Diagnosis not present

## 2017-09-15 DIAGNOSIS — H9311 Tinnitus, right ear: Secondary | ICD-10-CM | POA: Diagnosis not present

## 2017-09-15 DIAGNOSIS — H8101 Meniere's disease, right ear: Secondary | ICD-10-CM | POA: Diagnosis not present

## 2017-10-20 DIAGNOSIS — G4733 Obstructive sleep apnea (adult) (pediatric): Secondary | ICD-10-CM | POA: Diagnosis not present

## 2017-11-19 DIAGNOSIS — G4733 Obstructive sleep apnea (adult) (pediatric): Secondary | ICD-10-CM | POA: Diagnosis not present

## 2017-11-21 DIAGNOSIS — J3089 Other allergic rhinitis: Secondary | ICD-10-CM | POA: Diagnosis not present

## 2017-11-21 DIAGNOSIS — Z0000 Encounter for general adult medical examination without abnormal findings: Secondary | ICD-10-CM | POA: Diagnosis not present

## 2017-11-21 DIAGNOSIS — F411 Generalized anxiety disorder: Secondary | ICD-10-CM | POA: Diagnosis not present

## 2017-11-21 DIAGNOSIS — Z6841 Body Mass Index (BMI) 40.0 and over, adult: Secondary | ICD-10-CM | POA: Diagnosis not present

## 2017-12-19 DIAGNOSIS — G4733 Obstructive sleep apnea (adult) (pediatric): Secondary | ICD-10-CM | POA: Diagnosis not present

## 2017-12-22 DIAGNOSIS — L821 Other seborrheic keratosis: Secondary | ICD-10-CM | POA: Diagnosis not present

## 2017-12-22 DIAGNOSIS — D2239 Melanocytic nevi of other parts of face: Secondary | ICD-10-CM | POA: Diagnosis not present

## 2017-12-22 DIAGNOSIS — L820 Inflamed seborrheic keratosis: Secondary | ICD-10-CM | POA: Diagnosis not present

## 2017-12-22 DIAGNOSIS — D225 Melanocytic nevi of trunk: Secondary | ICD-10-CM | POA: Diagnosis not present

## 2017-12-22 DIAGNOSIS — L814 Other melanin hyperpigmentation: Secondary | ICD-10-CM | POA: Diagnosis not present

## 2018-01-19 DIAGNOSIS — G4733 Obstructive sleep apnea (adult) (pediatric): Secondary | ICD-10-CM | POA: Diagnosis not present

## 2018-02-18 DIAGNOSIS — G4733 Obstructive sleep apnea (adult) (pediatric): Secondary | ICD-10-CM | POA: Diagnosis not present

## 2018-03-13 DIAGNOSIS — Z6841 Body Mass Index (BMI) 40.0 and over, adult: Secondary | ICD-10-CM | POA: Diagnosis not present

## 2018-03-13 DIAGNOSIS — Z1231 Encounter for screening mammogram for malignant neoplasm of breast: Secondary | ICD-10-CM | POA: Diagnosis not present

## 2018-03-13 DIAGNOSIS — Z01419 Encounter for gynecological examination (general) (routine) without abnormal findings: Secondary | ICD-10-CM | POA: Diagnosis not present

## 2018-03-16 ENCOUNTER — Other Ambulatory Visit: Payer: Self-pay | Admitting: Obstetrics and Gynecology

## 2018-03-16 DIAGNOSIS — R928 Other abnormal and inconclusive findings on diagnostic imaging of breast: Secondary | ICD-10-CM

## 2018-03-20 ENCOUNTER — Ambulatory Visit
Admission: RE | Admit: 2018-03-20 | Discharge: 2018-03-20 | Disposition: A | Discharge status: Home / Self Care | Payer: BLUE CROSS/BLUE SHIELD | Source: Ambulatory Visit | Attending: Obstetrics and Gynecology | Admitting: Obstetrics and Gynecology

## 2018-03-20 DIAGNOSIS — R928 Other abnormal and inconclusive findings on diagnostic imaging of breast: Secondary | ICD-10-CM

## 2018-03-20 DIAGNOSIS — N6002 Solitary cyst of left breast: Secondary | ICD-10-CM | POA: Diagnosis not present

## 2018-03-20 DIAGNOSIS — G4733 Obstructive sleep apnea (adult) (pediatric): Secondary | ICD-10-CM | POA: Diagnosis not present

## 2018-04-03 DIAGNOSIS — H43812 Vitreous degeneration, left eye: Secondary | ICD-10-CM | POA: Diagnosis not present

## 2018-06-25 DIAGNOSIS — R590 Localized enlarged lymph nodes: Secondary | ICD-10-CM | POA: Diagnosis not present

## 2018-06-25 DIAGNOSIS — F320 Major depressive disorder, single episode, mild: Secondary | ICD-10-CM | POA: Diagnosis not present

## 2018-12-24 DIAGNOSIS — L820 Inflamed seborrheic keratosis: Secondary | ICD-10-CM | POA: Diagnosis not present

## 2018-12-24 DIAGNOSIS — L719 Rosacea, unspecified: Secondary | ICD-10-CM | POA: Diagnosis not present

## 2018-12-24 DIAGNOSIS — D2239 Melanocytic nevi of other parts of face: Secondary | ICD-10-CM | POA: Diagnosis not present

## 2018-12-24 DIAGNOSIS — D1801 Hemangioma of skin and subcutaneous tissue: Secondary | ICD-10-CM | POA: Diagnosis not present

## 2018-12-24 DIAGNOSIS — D225 Melanocytic nevi of trunk: Secondary | ICD-10-CM | POA: Diagnosis not present

## 2019-02-12 DIAGNOSIS — H93291 Other abnormal auditory perceptions, right ear: Secondary | ICD-10-CM | POA: Diagnosis not present

## 2019-02-12 DIAGNOSIS — H8101 Meniere's disease, right ear: Secondary | ICD-10-CM | POA: Diagnosis not present

## 2019-03-15 DIAGNOSIS — Z1231 Encounter for screening mammogram for malignant neoplasm of breast: Secondary | ICD-10-CM | POA: Diagnosis not present

## 2019-03-15 DIAGNOSIS — Z6841 Body Mass Index (BMI) 40.0 and over, adult: Secondary | ICD-10-CM | POA: Diagnosis not present

## 2019-03-15 DIAGNOSIS — Z01419 Encounter for gynecological examination (general) (routine) without abnormal findings: Secondary | ICD-10-CM | POA: Diagnosis not present

## 2019-05-13 DIAGNOSIS — Z6841 Body Mass Index (BMI) 40.0 and over, adult: Secondary | ICD-10-CM | POA: Diagnosis not present

## 2019-05-13 DIAGNOSIS — F411 Generalized anxiety disorder: Secondary | ICD-10-CM | POA: Diagnosis not present

## 2019-05-13 DIAGNOSIS — K219 Gastro-esophageal reflux disease without esophagitis: Secondary | ICD-10-CM | POA: Diagnosis not present

## 2019-06-10 DIAGNOSIS — Z0000 Encounter for general adult medical examination without abnormal findings: Secondary | ICD-10-CM | POA: Diagnosis not present

## 2019-06-10 DIAGNOSIS — Z6841 Body Mass Index (BMI) 40.0 and over, adult: Secondary | ICD-10-CM | POA: Diagnosis not present

## 2019-06-10 DIAGNOSIS — Z1389 Encounter for screening for other disorder: Secondary | ICD-10-CM | POA: Diagnosis not present

## 2019-06-24 DIAGNOSIS — G4733 Obstructive sleep apnea (adult) (pediatric): Secondary | ICD-10-CM | POA: Diagnosis not present

## 2019-07-20 DIAGNOSIS — F331 Major depressive disorder, recurrent, moderate: Secondary | ICD-10-CM | POA: Diagnosis not present

## 2019-10-12 DIAGNOSIS — Z6841 Body Mass Index (BMI) 40.0 and over, adult: Secondary | ICD-10-CM | POA: Diagnosis not present

## 2019-10-12 DIAGNOSIS — K219 Gastro-esophageal reflux disease without esophagitis: Secondary | ICD-10-CM | POA: Diagnosis not present

## 2019-10-12 DIAGNOSIS — F411 Generalized anxiety disorder: Secondary | ICD-10-CM | POA: Diagnosis not present

## 2019-10-12 DIAGNOSIS — Z713 Dietary counseling and surveillance: Secondary | ICD-10-CM | POA: Diagnosis not present

## 2019-11-18 DIAGNOSIS — E611 Iron deficiency: Secondary | ICD-10-CM | POA: Diagnosis not present

## 2019-11-18 DIAGNOSIS — R7301 Impaired fasting glucose: Secondary | ICD-10-CM | POA: Diagnosis not present

## 2019-11-18 DIAGNOSIS — E559 Vitamin D deficiency, unspecified: Secondary | ICD-10-CM | POA: Diagnosis not present

## 2019-11-18 DIAGNOSIS — E7800 Pure hypercholesterolemia, unspecified: Secondary | ICD-10-CM | POA: Diagnosis not present

## 2019-11-18 DIAGNOSIS — N951 Menopausal and female climacteric states: Secondary | ICD-10-CM | POA: Diagnosis not present

## 2019-11-18 DIAGNOSIS — Z6841 Body Mass Index (BMI) 40.0 and over, adult: Secondary | ICD-10-CM | POA: Diagnosis not present

## 2019-11-23 DIAGNOSIS — G4733 Obstructive sleep apnea (adult) (pediatric): Secondary | ICD-10-CM | POA: Diagnosis not present

## 2019-11-23 DIAGNOSIS — Z1331 Encounter for screening for depression: Secondary | ICD-10-CM | POA: Diagnosis not present

## 2019-11-23 DIAGNOSIS — Z1339 Encounter for screening examination for other mental health and behavioral disorders: Secondary | ICD-10-CM | POA: Diagnosis not present

## 2019-11-23 DIAGNOSIS — M2559 Pain in other specified joint: Secondary | ICD-10-CM | POA: Diagnosis not present

## 2019-11-23 DIAGNOSIS — R6882 Decreased libido: Secondary | ICD-10-CM | POA: Diagnosis not present

## 2019-11-23 DIAGNOSIS — N951 Menopausal and female climacteric states: Secondary | ICD-10-CM | POA: Diagnosis not present

## 2019-12-06 DIAGNOSIS — E7800 Pure hypercholesterolemia, unspecified: Secondary | ICD-10-CM | POA: Diagnosis not present

## 2019-12-06 DIAGNOSIS — Z6841 Body Mass Index (BMI) 40.0 and over, adult: Secondary | ICD-10-CM | POA: Diagnosis not present

## 2019-12-14 DIAGNOSIS — D485 Neoplasm of uncertain behavior of skin: Secondary | ICD-10-CM | POA: Diagnosis not present

## 2019-12-14 DIAGNOSIS — L918 Other hypertrophic disorders of the skin: Secondary | ICD-10-CM | POA: Diagnosis not present

## 2019-12-14 DIAGNOSIS — D225 Melanocytic nevi of trunk: Secondary | ICD-10-CM | POA: Diagnosis not present

## 2019-12-14 DIAGNOSIS — D2239 Melanocytic nevi of other parts of face: Secondary | ICD-10-CM | POA: Diagnosis not present

## 2019-12-14 DIAGNOSIS — L814 Other melanin hyperpigmentation: Secondary | ICD-10-CM | POA: Diagnosis not present

## 2019-12-20 DIAGNOSIS — U071 COVID-19: Secondary | ICD-10-CM | POA: Diagnosis not present

## 2019-12-20 DIAGNOSIS — Z03818 Encounter for observation for suspected exposure to other biological agents ruled out: Secondary | ICD-10-CM | POA: Diagnosis not present

## 2019-12-23 DIAGNOSIS — N951 Menopausal and female climacteric states: Secondary | ICD-10-CM | POA: Diagnosis not present

## 2019-12-23 DIAGNOSIS — E611 Iron deficiency: Secondary | ICD-10-CM | POA: Diagnosis not present

## 2019-12-23 DIAGNOSIS — Z6841 Body Mass Index (BMI) 40.0 and over, adult: Secondary | ICD-10-CM | POA: Diagnosis not present

## 2019-12-30 DIAGNOSIS — G4733 Obstructive sleep apnea (adult) (pediatric): Secondary | ICD-10-CM | POA: Diagnosis not present

## 2019-12-30 DIAGNOSIS — R7301 Impaired fasting glucose: Secondary | ICD-10-CM | POA: Diagnosis not present

## 2019-12-30 DIAGNOSIS — Z6841 Body Mass Index (BMI) 40.0 and over, adult: Secondary | ICD-10-CM | POA: Diagnosis not present

## 2019-12-31 DIAGNOSIS — Z03818 Encounter for observation for suspected exposure to other biological agents ruled out: Secondary | ICD-10-CM | POA: Diagnosis not present

## 2020-01-06 DIAGNOSIS — E611 Iron deficiency: Secondary | ICD-10-CM | POA: Diagnosis not present

## 2020-01-06 DIAGNOSIS — Z6841 Body Mass Index (BMI) 40.0 and over, adult: Secondary | ICD-10-CM | POA: Diagnosis not present

## 2020-01-07 DIAGNOSIS — F4323 Adjustment disorder with mixed anxiety and depressed mood: Secondary | ICD-10-CM | POA: Diagnosis not present

## 2020-01-12 DIAGNOSIS — R7303 Prediabetes: Secondary | ICD-10-CM | POA: Diagnosis not present

## 2020-01-12 DIAGNOSIS — Z6841 Body Mass Index (BMI) 40.0 and over, adult: Secondary | ICD-10-CM | POA: Diagnosis not present

## 2020-01-14 DIAGNOSIS — F320 Major depressive disorder, single episode, mild: Secondary | ICD-10-CM | POA: Diagnosis not present

## 2020-01-14 DIAGNOSIS — Z713 Dietary counseling and surveillance: Secondary | ICD-10-CM | POA: Diagnosis not present

## 2020-01-14 DIAGNOSIS — K219 Gastro-esophageal reflux disease without esophagitis: Secondary | ICD-10-CM | POA: Diagnosis not present

## 2020-01-14 DIAGNOSIS — G4733 Obstructive sleep apnea (adult) (pediatric): Secondary | ICD-10-CM | POA: Diagnosis not present

## 2020-01-14 DIAGNOSIS — F411 Generalized anxiety disorder: Secondary | ICD-10-CM | POA: Diagnosis not present

## 2020-01-14 DIAGNOSIS — Z23 Encounter for immunization: Secondary | ICD-10-CM | POA: Diagnosis not present

## 2020-01-17 DIAGNOSIS — F4323 Adjustment disorder with mixed anxiety and depressed mood: Secondary | ICD-10-CM | POA: Diagnosis not present

## 2020-01-18 DIAGNOSIS — G4733 Obstructive sleep apnea (adult) (pediatric): Secondary | ICD-10-CM | POA: Diagnosis not present

## 2020-01-19 DIAGNOSIS — Z6841 Body Mass Index (BMI) 40.0 and over, adult: Secondary | ICD-10-CM | POA: Diagnosis not present

## 2020-01-19 DIAGNOSIS — E786 Lipoprotein deficiency: Secondary | ICD-10-CM | POA: Diagnosis not present

## 2020-01-26 DIAGNOSIS — U071 COVID-19: Secondary | ICD-10-CM | POA: Diagnosis not present

## 2020-01-26 DIAGNOSIS — Z20828 Contact with and (suspected) exposure to other viral communicable diseases: Secondary | ICD-10-CM | POA: Diagnosis not present

## 2020-01-31 DIAGNOSIS — F4323 Adjustment disorder with mixed anxiety and depressed mood: Secondary | ICD-10-CM | POA: Diagnosis not present

## 2020-02-01 DIAGNOSIS — Z30433 Encounter for removal and reinsertion of intrauterine contraceptive device: Secondary | ICD-10-CM | POA: Diagnosis not present

## 2020-02-03 DIAGNOSIS — Z6841 Body Mass Index (BMI) 40.0 and over, adult: Secondary | ICD-10-CM | POA: Diagnosis not present

## 2020-02-03 DIAGNOSIS — R7303 Prediabetes: Secondary | ICD-10-CM | POA: Diagnosis not present

## 2020-02-11 DIAGNOSIS — R42 Dizziness and giddiness: Secondary | ICD-10-CM | POA: Diagnosis not present

## 2020-02-11 DIAGNOSIS — H938X1 Other specified disorders of right ear: Secondary | ICD-10-CM | POA: Diagnosis not present

## 2020-02-11 DIAGNOSIS — H9191 Unspecified hearing loss, right ear: Secondary | ICD-10-CM | POA: Diagnosis not present

## 2020-02-11 DIAGNOSIS — H9311 Tinnitus, right ear: Secondary | ICD-10-CM | POA: Diagnosis not present

## 2020-02-14 DIAGNOSIS — K219 Gastro-esophageal reflux disease without esophagitis: Secondary | ICD-10-CM | POA: Diagnosis not present

## 2020-02-14 DIAGNOSIS — Z6841 Body Mass Index (BMI) 40.0 and over, adult: Secondary | ICD-10-CM | POA: Diagnosis not present

## 2020-03-02 DIAGNOSIS — N951 Menopausal and female climacteric states: Secondary | ICD-10-CM | POA: Diagnosis not present

## 2020-03-02 DIAGNOSIS — Z6841 Body Mass Index (BMI) 40.0 and over, adult: Secondary | ICD-10-CM | POA: Diagnosis not present

## 2020-03-02 DIAGNOSIS — E7800 Pure hypercholesterolemia, unspecified: Secondary | ICD-10-CM | POA: Diagnosis not present

## 2020-03-09 DIAGNOSIS — N951 Menopausal and female climacteric states: Secondary | ICD-10-CM | POA: Diagnosis not present

## 2020-03-09 DIAGNOSIS — F419 Anxiety disorder, unspecified: Secondary | ICD-10-CM | POA: Diagnosis not present

## 2020-03-09 DIAGNOSIS — E7800 Pure hypercholesterolemia, unspecified: Secondary | ICD-10-CM | POA: Diagnosis not present

## 2020-03-09 DIAGNOSIS — R7301 Impaired fasting glucose: Secondary | ICD-10-CM | POA: Diagnosis not present

## 2020-03-21 DIAGNOSIS — U071 COVID-19: Secondary | ICD-10-CM | POA: Diagnosis not present

## 2020-03-21 DIAGNOSIS — Z03818 Encounter for observation for suspected exposure to other biological agents ruled out: Secondary | ICD-10-CM | POA: Diagnosis not present

## 2020-03-31 ENCOUNTER — Ambulatory Visit
Admission: RE | Admit: 2020-03-31 | Discharge: 2020-03-31 | Disposition: A | Discharge status: Home / Self Care | Payer: BLUE CROSS/BLUE SHIELD | Source: Ambulatory Visit | Attending: Radiology | Admitting: Radiology

## 2020-03-31 ENCOUNTER — Other Ambulatory Visit: Payer: Self-pay

## 2020-03-31 ENCOUNTER — Other Ambulatory Visit: Payer: Self-pay | Admitting: Radiology

## 2020-03-31 DIAGNOSIS — L732 Hidradenitis suppurativa: Secondary | ICD-10-CM | POA: Diagnosis not present

## 2020-03-31 DIAGNOSIS — N644 Mastodynia: Secondary | ICD-10-CM | POA: Diagnosis not present

## 2020-03-31 DIAGNOSIS — N6310 Unspecified lump in the right breast, unspecified quadrant: Secondary | ICD-10-CM | POA: Diagnosis not present

## 2020-03-31 DIAGNOSIS — R922 Inconclusive mammogram: Secondary | ICD-10-CM | POA: Diagnosis not present

## 2020-05-09 DIAGNOSIS — R590 Localized enlarged lymph nodes: Secondary | ICD-10-CM | POA: Diagnosis not present

## 2020-06-07 DIAGNOSIS — R590 Localized enlarged lymph nodes: Secondary | ICD-10-CM | POA: Diagnosis not present

## 2020-06-13 DIAGNOSIS — R590 Localized enlarged lymph nodes: Secondary | ICD-10-CM | POA: Diagnosis not present

## 2020-06-21 DIAGNOSIS — Z01419 Encounter for gynecological examination (general) (routine) without abnormal findings: Secondary | ICD-10-CM | POA: Diagnosis not present

## 2020-06-21 DIAGNOSIS — Z6841 Body Mass Index (BMI) 40.0 and over, adult: Secondary | ICD-10-CM | POA: Diagnosis not present

## 2020-07-14 DIAGNOSIS — R600 Localized edema: Secondary | ICD-10-CM | POA: Diagnosis not present

## 2020-07-19 DIAGNOSIS — L821 Other seborrheic keratosis: Secondary | ICD-10-CM | POA: Diagnosis not present

## 2020-07-19 DIAGNOSIS — B079 Viral wart, unspecified: Secondary | ICD-10-CM | POA: Diagnosis not present

## 2020-07-19 DIAGNOSIS — L710 Perioral dermatitis: Secondary | ICD-10-CM | POA: Diagnosis not present

## 2020-07-20 DIAGNOSIS — M25562 Pain in left knee: Secondary | ICD-10-CM | POA: Diagnosis not present

## 2020-07-27 DIAGNOSIS — G4733 Obstructive sleep apnea (adult) (pediatric): Secondary | ICD-10-CM | POA: Diagnosis not present

## 2020-07-27 DIAGNOSIS — K219 Gastro-esophageal reflux disease without esophagitis: Secondary | ICD-10-CM | POA: Diagnosis not present

## 2020-07-27 DIAGNOSIS — Z9989 Dependence on other enabling machines and devices: Secondary | ICD-10-CM | POA: Diagnosis not present

## 2020-07-29 ENCOUNTER — Other Ambulatory Visit (HOSPITAL_COMMUNITY): Payer: Self-pay | Admitting: General Surgery

## 2020-07-29 DIAGNOSIS — R7303 Prediabetes: Secondary | ICD-10-CM

## 2020-08-16 ENCOUNTER — Ambulatory Visit (HOSPITAL_COMMUNITY)
Admission: RE | Admit: 2020-08-16 | Discharge: 2020-08-16 | Disposition: A | Discharge status: Home / Self Care | Payer: BC Managed Care – PPO | Source: Ambulatory Visit | Attending: General Surgery | Admitting: General Surgery

## 2020-08-16 ENCOUNTER — Other Ambulatory Visit: Payer: Self-pay

## 2020-08-16 DIAGNOSIS — Z01818 Encounter for other preprocedural examination: Secondary | ICD-10-CM | POA: Diagnosis not present

## 2020-08-16 DIAGNOSIS — R7303 Prediabetes: Secondary | ICD-10-CM | POA: Insufficient documentation

## 2020-08-16 DIAGNOSIS — K224 Dyskinesia of esophagus: Secondary | ICD-10-CM | POA: Diagnosis not present

## 2020-08-24 DIAGNOSIS — H9041 Sensorineural hearing loss, unilateral, right ear, with unrestricted hearing on the contralateral side: Secondary | ICD-10-CM | POA: Insufficient documentation

## 2020-09-01 DIAGNOSIS — Z1211 Encounter for screening for malignant neoplasm of colon: Secondary | ICD-10-CM | POA: Diagnosis not present

## 2020-09-01 DIAGNOSIS — D122 Benign neoplasm of ascending colon: Secondary | ICD-10-CM | POA: Diagnosis not present

## 2020-09-01 DIAGNOSIS — K635 Polyp of colon: Secondary | ICD-10-CM | POA: Diagnosis not present

## 2020-09-13 DIAGNOSIS — L710 Perioral dermatitis: Secondary | ICD-10-CM | POA: Diagnosis not present

## 2020-09-19 ENCOUNTER — Encounter: Payer: Self-pay | Admitting: Skilled Nursing Facility1

## 2020-09-19 ENCOUNTER — Encounter: Payer: BC Managed Care – PPO | Attending: General Surgery | Admitting: Skilled Nursing Facility1

## 2020-09-19 ENCOUNTER — Other Ambulatory Visit: Payer: Self-pay

## 2020-09-19 DIAGNOSIS — E669 Obesity, unspecified: Secondary | ICD-10-CM | POA: Insufficient documentation

## 2020-09-19 NOTE — Progress Notes (Signed)
Nutrition Assessment for Bariatric Surgery Medical Nutrition Therapy Appt Start Time: 9:30   End Time: 10:40  Patient was seen on 09/19/2020 for Pre-Operative Nutrition Assessment. Letter of approval faxed to [facility] bariatric surgery program coordinator on 09/19/2020.   Referral stated Supervised Weight Loss (SWL) visits needed: 0  Not cleared at this time:  Pt to follow up for minimum of one more visit to assist pt with progressing through stages of change/further nutrition education. RD advised pt that this follow up visit is not mandated through insurance. Pt verbalized agreement.   Planned surgery: RYGB Pt expectation of surgery: to lose weight Pt expectation of dietitian: to help understand how to eat moving forward     NUTRITION ASSESSMENT   Anthropometrics  Start weight at [facility]: 262 lbs (date: 09/19/2020)  Height: 63 in BMI: 46.41 kg/m2     Clinical  Medical hx: prediabetes, sleep apnea  Medications: see list  Labs: not updated in EMR Notable signs/symptoms: none identified  Any previous deficiencies? No  Micronutrient Nutrition Focused Physical Exam: Hair: No issues observed Eyes: No issues observed Mouth: No issues observed Neck: No issues observed Nails: No issues observed Skin: No issues observed  Lifestyle & Dietary Hx  Pt state she has seen 3 mental health therapist and has found she is an emotional eater. Pt state she feels journaling helps inclusive of her feelings.   Pt states she realized recently she eats past fullness due to being a member of the clean plate club.  Pt state most of her meals are eaten out.   Pt states she wants to work on her relationship with food prior to surgery to ensure she is successful after surgery.   24-Hr Dietary Recall: most foods eaten out First Meal: coffee or fast food Snack:  Second Meal: sandwich + chips Snack:  Third Meal: burrito or pizza Snack: ice cream Beverages: juice, coffee + heavy  cream, half and half tea, Gatorade, water   Estimated Energy Needs Calories: 1500   NUTRITION DIAGNOSIS  Overweight/obesity ([place]-3.3) related to past poor dietary habits and physical inactivity as evidenced by patient w/ planned RYGB surgery following dietary guidelines for continued weight loss.    NUTRITION INTERVENTION  Nutrition counseling (C-1) and education (E-2) to facilitate bariatric surgery goals.  Educated pt on micronutrient deficiencies post surgery and strategies to mitigate that risk   Pre-Op Goals Reviewed with the Patient Track food and beverage intake (pen and paper, MyFitness Pal, Baritastic app, etc.) Make healthy food choices while monitoring portion sizes: avoid eating out Consume 3 meals per day or try to eat every 3-5 hours Avoid concentrated sugars and fried foods Keep sugar & fat in the single digits per serving on food labels Practice CHEWING your food (aim for applesauce consistency) Practice not drinking 15 minutes before, during, and 30 minutes after each meal and snack Avoid all carbonated beverages (ex: soda, sparkling beverages)  Limit caffeinated beverages (ex: coffee, tea, energy drinks) Avoid all sugar-sweetened beverages (ex: regular soda, sports drinks)  Avoid alcohol  Aim for 64-100 ounces of FLUID daily (with at least half of fluid intake being plain water)  Aim for at least 60-80 grams of PROTEIN daily Look for a liquid protein source that contains ?15 g protein and ?5 g carbohydrate (ex: shakes, drinks, shots) Make a list of non-food related activities Physical activity is an important part of a healthy lifestyle so keep it moving! The goal is to reach 150 minutes of exercise per week,  including cardiovascular and weight baring activity.  *Goals that are bolded indicate the pt would like to start working towards these  Handouts Provided Include  Bariatric Surgery handouts (Nutrition Visits, Pre-Op Goals, Protein Shakes, Vitamins &  Minerals) Needs  Emotions Mindful meals exercise   Learning Style & Readiness for Change Teaching method utilized: Visual & Auditory  Demonstrated degree of understanding via: Teach Back  Readiness Level: pre-contemplative  Barriers to learning/adherence to lifestyle change: emotional eating   RD's Notes for Next Visit Assess pts adherence to chosen goals      MONITORING & EVALUATION Dietary intake, weekly physical activity, body weight, and pre-op goals reached at next nutrition visit.    Next Steps  Patient is to follow up at [facility] for Pre-Op Class >2 weeks before surgery for further nutrition education.  Return for next appt

## 2020-10-02 ENCOUNTER — Other Ambulatory Visit: Payer: Self-pay

## 2020-10-02 ENCOUNTER — Encounter: Payer: BC Managed Care – PPO | Attending: General Surgery | Admitting: Skilled Nursing Facility1

## 2020-10-02 DIAGNOSIS — E669 Obesity, unspecified: Secondary | ICD-10-CM | POA: Diagnosis not present

## 2020-10-02 NOTE — Progress Notes (Signed)
Supervised Weight Loss Visit Bariatric Nutrition Education  Planned Surgery: RYGB   NUTRITION ASSESSMENT  Pt completed visits.   Pt has cleared nutrition requirements.    Pt is clear to move forward in the process; she will continue with dietitian as the surgery pathway is forwarded per her request.   Anthropometrics  Start weight at [facility]: 262 lbs (date: 09/19/2020)  Weight: 262.7 pounds Height: 63 in BMI: 46.41 kg/m2     Clinical  Medical hx: prediabetes, sleep apnea  Medications: see list  Labs: not updated in EMR Notable signs/symptoms: none identified  Any previous deficiencies? No  Lifestyle & Dietary Hx  Pt states she is realizing she has been eating calorically dense foods due to thinking she will never have them after surgery. Pt states after logging her foods and completing the mindfulness work she has learned a lot about her relationship with food Pt states she is feeling overwhelmed with trying to understand her food choices.  Pt states she has been working on Only eating when hungry.  Pt states she has been working on Appropriately filling the need or the feeling rather than using food.   Pt states in the last week she has been working on eating more at home instead of out.  Pt states she has learned When feeling irritated for frustration needs to relax so use food to gain that relaxed feeling.  Pt states she has been trying to sip more water throughout the day.   Estimated daily fluid intake: unknown oz Supplements:  Current average weekly physical activity: ADL's  24-Hr Dietary Recall First Meal: granola bar Snack:  Second Meal: eating out but trying to choose healthier options such as salads Snack:  Third Meal: steak + rice + green beans Snack:  Beverages: coffee, water, juice, half the half tea  Estimated Energy Needs Calories: 1500   NUTRITION DIAGNOSIS  Overweight/obesity ([place]-3.3) related to past poor dietary habits and physical inactivity as  evidenced by patient w/ planned RYGB surgery following dietary guidelines for continued weight loss.   NUTRITION INTERVENTION  Nutrition counseling (C-1) and education (E-2) to facilitate bariatric surgery goals.  Pre-Op Goals Progress & New Goals Continue: Make healthy food choices while monitoring portion sizes: avoid eating out Continue: Practice CHEWING your food (aim for applesauce consistency) Continue tracking and identifying emotions and make correlations: why do you feel the need for calm after dinner?  Handouts Provided Include  Work on why you need calm after dinner  Learning Style & Readiness for Change Teaching method utilized: Visual & Auditory  Demonstrated degree of understanding via: Teach Back  Readiness Level: contemplative/action Barriers to learning/adherence to lifestyle change: emotional eating  RD's Notes for next Visit  Assess pts adherence to chosen goals   MONITORING & EVALUATION Dietary intake, weekly physical activity, body weight, and pre-op goals in 1 month.   Next Steps  Patient is to return to [facility] for pre-op class Pt has completed visits. No further supervised visits required

## 2020-10-12 DIAGNOSIS — F411 Generalized anxiety disorder: Secondary | ICD-10-CM | POA: Diagnosis not present

## 2020-10-12 DIAGNOSIS — K219 Gastro-esophageal reflux disease without esophagitis: Secondary | ICD-10-CM | POA: Diagnosis not present

## 2020-10-12 DIAGNOSIS — Z6841 Body Mass Index (BMI) 40.0 and over, adult: Secondary | ICD-10-CM | POA: Diagnosis not present

## 2020-10-16 ENCOUNTER — Ambulatory Visit: Payer: BC Managed Care – PPO | Admitting: Skilled Nursing Facility1

## 2020-10-24 DIAGNOSIS — E041 Nontoxic single thyroid nodule: Secondary | ICD-10-CM | POA: Diagnosis not present

## 2020-10-24 DIAGNOSIS — R590 Localized enlarged lymph nodes: Secondary | ICD-10-CM | POA: Diagnosis not present

## 2020-10-24 DIAGNOSIS — R9389 Abnormal findings on diagnostic imaging of other specified body structures: Secondary | ICD-10-CM | POA: Diagnosis not present

## 2020-10-24 DIAGNOSIS — I6523 Occlusion and stenosis of bilateral carotid arteries: Secondary | ICD-10-CM | POA: Diagnosis not present

## 2020-10-24 DIAGNOSIS — R599 Enlarged lymph nodes, unspecified: Secondary | ICD-10-CM | POA: Diagnosis not present

## 2020-10-27 DIAGNOSIS — G4733 Obstructive sleep apnea (adult) (pediatric): Secondary | ICD-10-CM | POA: Diagnosis not present

## 2020-10-30 DIAGNOSIS — R7303 Prediabetes: Secondary | ICD-10-CM | POA: Diagnosis not present

## 2020-11-08 DIAGNOSIS — B029 Zoster without complications: Secondary | ICD-10-CM | POA: Diagnosis not present

## 2020-12-14 DIAGNOSIS — L821 Other seborrheic keratosis: Secondary | ICD-10-CM | POA: Diagnosis not present

## 2020-12-14 DIAGNOSIS — L814 Other melanin hyperpigmentation: Secondary | ICD-10-CM | POA: Diagnosis not present

## 2020-12-14 DIAGNOSIS — D225 Melanocytic nevi of trunk: Secondary | ICD-10-CM | POA: Diagnosis not present

## 2020-12-14 DIAGNOSIS — D2239 Melanocytic nevi of other parts of face: Secondary | ICD-10-CM | POA: Diagnosis not present

## 2020-12-18 ENCOUNTER — Encounter: Payer: BC Managed Care – PPO | Attending: General Surgery | Admitting: Skilled Nursing Facility1

## 2020-12-18 ENCOUNTER — Other Ambulatory Visit: Payer: Self-pay

## 2020-12-18 DIAGNOSIS — E669 Obesity, unspecified: Secondary | ICD-10-CM | POA: Diagnosis not present

## 2020-12-19 NOTE — Progress Notes (Signed)
Pre-Operative Nutrition Class:    Patient was seen on 12/18/2020 for Pre-Operative Bariatric Surgery Education [facility]    Surgery date: 01/01/2021 Surgery type: RYGB Start weight at [facility]: 262 Weight today: 261.3 pounds  The following the learning objectives were met by the patient during this course: Identify Pre-Op Dietary Goals and will begin 2 weeks pre-operatively Identify appropriate sources of fluids and proteins  State protein recommendations and appropriate sources pre and post-operatively Identify Post-Operative Dietary Goals and will follow for 2 weeks post-operatively Identify appropriate multivitamin and calcium sources Describe the need for physical activity post-operatively and will follow MD recommendations State when to call healthcare provider regarding medication questions or post-operative complications When having a diagnosis of diabetes understanding hypoglycemia symptoms and the inclusion of 1 complex carbohydrate per meal  Handouts given during class include: Pre-Op Bariatric Surgery Diet Handout Protein Shake Handout Post-Op Bariatric Surgery Nutrition Handout BELT Program Information Flyer Support Group Information Flyer [facility] Bariatric Supplements Price List  Follow-Up Plan: Patient will follow-up at [facility] 2 weeks post operatively for diet advancement per MD.

## 2020-12-26 NOTE — Progress Notes (Signed)
Please place orders for PAT appointment scheduled 12/27/20.

## 2020-12-26 NOTE — Patient Instructions (Addendum)
DUE TO COVID-19 ONLY ONE VISITOR IS ALLOWED TO COME WITH YOU AND STAY IN THE WAITING ROOM ONLY DURING PRE OP AND PROCEDURE.   **NO VISITORS ARE ALLOWED IN THE SHORT STAY AREA OR RECOVERY ROOM!!**  IF YOU WILL BE ADMITTED INTO THE HOSPITAL YOU ARE ALLOWED ONLY TWO SUPPORT PEOPLE DURING VISITATION HOURS ONLY (10AM -8PM)   The support person(s) may change daily. The support person(s) must pass our screening, gel in and out, and wear a mask at all times, including in the patient's room. Patients must also wear a mask when staff or their support person are in the room.  No visitors under the age of 41. Any visitor under the age of 61 must be accompanied by an adult.    COVID SWAB TESTING MUST BE COMPLETED ON:  12/28/20 **MUST PRESENT COMPLETED FORM AT TESTING SITE**    [address] [place] (backside of the building) No appointment needed. Open 8am-3pm. You are not required to quarantine, however you are required to wear a well-fitted mask when you are out and around people not in your household.  Hand Hygiene often Do NOT share personal items Notify your provider if you are in close contact with someone who has COVID or you develop fever 100.4 or greater, new onset of sneezing, cough, sore throat, shortness of breath or body aches.       Your procedure is scheduled on: 01/01/21   Report to [facility] Main Entrance   Report to Short Stay at 5:15 AM   [facility])   Call this number if you have problems the morning of surgery [phone]   Do not eat solid food after 6pm day before surgery.   May have liquids until 4:30 AM day of surgery  CLEAR LIQUID DIET  Foods Allowed                                                                     Foods Excluded  Water, Black Coffee and tea (no milk or creamer)           liquids that you cannot  Plain Jell-O in any flavor  (No red)                                    see through such as: Fruit ices (not with fruit pulp)                                             milk, soups, orange juice              Iced Popsicles (No red)                                                All solid food  Apple juices Sports drinks like Gatorade (No red) Lightly seasoned clear broth or consume(fat free) Sugar   PAIN IS EXPECTED AFTER SURGERY AND WILL NOT BE COMPLETELY ELIMINATED. AMBULATION AND TYLENOL WILL HELP REDUCE INCISIONAL AND GAS PAIN. MOVEMENT IS KEY!  YOU ARE EXPECTED TO BE OUT OF BED WITHIN 4 HOURS OF ADMISSION TO YOUR PATIENT ROOM.  SITTING IN THE RECLINER THROUGHOUT THE DAY IS IMPORTANT FOR DRINKING FLUIDS AND MOVING GAS THROUGHOUT THE GI TRACT.  COMPRESSION STOCKINGS SHOULD BE WORN [facility] STAY UNLESS YOU ARE WALKING.   INCENTIVE SPIROMETER SHOULD BE USED EVERY HOUR WHILE AWAKE TO DECREASE POST-OPERATIVE COMPLICATIONS SUCH AS PNEUMONIA.  WHEN DISCHARGED HOME, IT IS IMPORTANT TO CONTINUE TO WALK EVERY HOUR AND USE THE INCENTIVE SPIROMETER EVERY HOUR.       The day of surgery:  Drink ONE (1) Pre-Surgery G2 by 4:30 am the morning of surgery. Drink in one sitting. Do not sip.  This drink was given to you during your hospital  pre-op appointment visit. Nothing else to drink after completing the  Pre-Surgery G2.          If you have questions, please contact your surgeon's office.     Oral Hygiene is also important to reduce your risk of infection.                                    Remember - BRUSH YOUR TEETH THE MORNING OF SURGERY WITH YOUR REGULAR TOOTHPASTE   Take these medicines the morning of surgery with A SIP OF WATER: Tylenol, Klonopin, Claritin, Singulair, Pantoprazole.                              You may not have any metal on your body including hair pins, jewelry, and body piercing             Do not wear make-up, lotions, powders, perfumes, or deodorant  Do not wear nail polish including gel and S&S, artificial/acrylic nails, or any other  type of covering on natural nails including finger and toenails. If you have artificial nails, gel coating, etc. that needs to be removed by a nail salon please have this removed prior to surgery or surgery may need to be canceled/ delayed if the surgeon/ anesthesia feels like they are unable to be safely monitored.   Do not shave  48 hours prior to surgery.    Do not bring valuables to the hospital. [facility] IS NOT             RESPONSIBLE   FOR VALUABLES.   Bring small overnight bag day of surgery.    Please read over the following fact sheets you were given: IF YOU HAVE QUESTIONS ABOUT YOUR PRE-OP INSTRUCTIONS PLEASE CALL [phone]- [facility] - Preparing for Surgery Before surgery, you can play an important role.  Because skin is not sterile, your skin needs to be as free of germs as possible.  You can reduce the number of germs on your skin by washing with CHG (chlorahexidine gluconate) soap before surgery.  CHG is an antiseptic cleaner which kills germs and bonds with the skin to continue killing germs even after washing. Please DO NOT use if you have an allergy to CHG or antibacterial soaps.  If your skin becomes reddened/irritated stop using the CHG and inform your  nurse when you arrive at Short Stay. Do not shave (including legs and underarms) for at least 48 hours prior to the first CHG shower.  You may shave your face/neck.  Please follow these instructions carefully:  1.  Shower with CHG Soap the night before surgery and the  morning of surgery.  2.  If you choose to wash your hair, wash your hair first as usual with your normal  shampoo.  3.  After you shampoo, rinse your hair and body thoroughly to remove the shampoo.                             4.  Use CHG as you would any other liquid soap.  You can apply chg directly to the skin and wash.  Gently with a scrungie or clean washcloth.  5.  Apply the CHG Soap to your body ONLY FROM THE NECK DOWN.   Do   not use on  face/ open                           Wound or open sores. Avoid contact with eyes, ears mouth and   genitals (private parts).                       Wash face,  Genitals (private parts) with your normal soap.             6.  Wash thoroughly, paying special attention to the area where your    surgery  will be performed.  7.  Thoroughly rinse your body with warm water from the neck down.  8.  DO NOT shower/wash with your normal soap after using and rinsing off the CHG Soap.                9.  Pat yourself dry with a clean towel.            10.  Wear clean pajamas.            11.  Place clean sheets on your bed the night of your first shower and do not  sleep with pets. Day of Surgery : Do not apply any lotions/deodorants the morning of surgery.  Please wear clean clothes to the hospital/surgery center.  FAILURE TO FOLLOW THESE INSTRUCTIONS MAY RESULT IN THE CANCELLATION OF YOUR SURGERY  PATIENT SIGNATURE_________________________________  NURSE SIGNATURE__________________________________  ________________________________________________________________________    [name]  An incentive spirometer is a tool that can help keep your lungs clear and active. This tool measures how well you are filling your lungs with each breath. Taking long deep breaths may help reverse or decrease the chance of developing breathing (pulmonary) problems (especially infection) following: A long period of time when you are unable to move or be active. BEFORE THE PROCEDURE  If the spirometer includes an indicator to show your best effort, your nurse or respiratory therapist will set it to a desired goal. If possible, sit up straight or lean slightly forward. Try not to slouch. Hold the incentive spirometer in an upright position. INSTRUCTIONS FOR USE  Sit on the edge of your bed if possible, or sit up as far as you can in bed or on a chair. Hold the incentive spirometer in an upright  position. Breathe out normally. Place the mouthpiece in your mouth and seal your lips tightly around it. Breathe in slowly and  as deeply as possible, raising the piston or the ball toward the top of the column. Hold your breath for 3-5 seconds or for as long as possible. Allow the piston or ball to fall to the bottom of the column. Remove the mouthpiece from your mouth and breathe out normally. Rest for a few seconds and repeat Steps 1 through 7 at least 10 times every 1-2 hours when you are awake. Take your time and take a few normal breaths between deep breaths. The spirometer may include an indicator to show your best effort. Use the indicator as a goal to work toward during each repetition. After each set of 10 deep breaths, practice coughing to be sure your lungs are clear. If you have an incision (the cut made at the time of surgery), support your incision when coughing by placing a pillow or rolled up towels firmly against it. Once you are able to get out of bed, walk around indoors and cough well. You may stop using the incentive spirometer when instructed by your caregiver.  RISKS AND COMPLICATIONS Take your time so you do not get dizzy or light-headed. If you are in pain, you may need to take or ask for pain medication before doing incentive spirometry. It is harder to take a deep breath if you are having pain. AFTER USE Rest and breathe slowly and easily. It can be helpful to keep track of a log of your progress. Your caregiver can provide you with a simple table to help with this. If you are using the spirometer at home, follow these instructions: SEEK MEDICAL CARE IF:  You are having difficultly using the spirometer. You have trouble using the spirometer as often as instructed. Your pain medication is not giving enough relief while using the spirometer. You develop fever of 100.5 F (38.1 C) or higher. SEEK IMMEDIATE MEDICAL CARE IF:  You cough up bloody sputum that had not been  present before. You develop fever of 102 F (38.9 C) or greater. You develop worsening pain at or near the incision site. MAKE SURE YOU:  Understand these instructions. Will watch your condition. Will get help right away if you are not doing well or get worse. Document Released: 07/22/2006 Document Revised: 06/03/2011 Document Reviewed: 09/22/2006 [facility] Patient Information 2014 [place], [place].   ________________________________________________________________________

## 2020-12-26 NOTE — Progress Notes (Addendum)
COVID swab appointment: 12/28/20  COVID Vaccine Completed: yes x3 Date COVID Vaccine completed: Has received booster: COVID vaccine manufacturer: Pfizer    Moderna   Date of COVID positive in last 90 days: no  PCP - Alinda Deem, MD Cardiologist - n/a  Chest x-ray - 08/16/20 Epic EKG - 08/16/20 Epic Stress Test - n/a ECHO - n/a Cardiac Cath - n/a Pacemaker/ICD device last checked: n/a Spinal Cord Stimulator: n/a  Sleep Study - yes positive  CPAP - yes every night  Fasting Blood Sugar - pre DM, doesn't check at home Checks Blood Sugar _____ times a day  Blood Thinner Instructions: n/a Aspirin Instructions: Last Dose:  Activity level: Can go up a flight of stairs and perform activities of daily living without stopping and without symptoms of chest pain or shortness of breath. SOB with stairs or hills, not new       Anesthesia review:   Patient denies shortness of breath, fever, cough and chest pain at PAT appointment   Patient verbalized understanding of instructions that were given to them at the PAT appointment. Patient was also instructed that they will need to review over the PAT instructions again at home before surgery.

## 2020-12-27 ENCOUNTER — Encounter (HOSPITAL_COMMUNITY)
Admission: RE | Admit: 2020-12-27 | Discharge: 2020-12-27 | Disposition: A | Discharge status: Home / Self Care | Payer: BC Managed Care – PPO | Source: Ambulatory Visit | Attending: General Surgery | Admitting: General Surgery

## 2020-12-27 ENCOUNTER — Ambulatory Visit: Payer: Self-pay | Admitting: General Surgery

## 2020-12-27 ENCOUNTER — Encounter (HOSPITAL_COMMUNITY): Payer: Self-pay

## 2020-12-27 DIAGNOSIS — G4733 Obstructive sleep apnea (adult) (pediatric): Secondary | ICD-10-CM | POA: Diagnosis not present

## 2020-12-27 DIAGNOSIS — K219 Gastro-esophageal reflux disease without esophagitis: Secondary | ICD-10-CM | POA: Diagnosis not present

## 2020-12-27 DIAGNOSIS — Z01818 Encounter for other preprocedural examination: Secondary | ICD-10-CM

## 2020-12-27 DIAGNOSIS — R7303 Prediabetes: Secondary | ICD-10-CM | POA: Diagnosis not present

## 2020-12-27 DIAGNOSIS — Z01812 Encounter for preprocedural laboratory examination: Secondary | ICD-10-CM | POA: Insufficient documentation

## 2020-12-27 DIAGNOSIS — E119 Type 2 diabetes mellitus without complications: Secondary | ICD-10-CM | POA: Diagnosis not present

## 2020-12-27 HISTORY — DX: Meniere's disease, unspecified ear: H81.09

## 2020-12-27 LAB — CBC WITH DIFFERENTIAL/PLATELET
Abs Immature Granulocytes: 0.03 10*3/uL (ref 0.00–0.07)
Basophils Absolute: 0.1 10*3/uL (ref 0.0–0.1)
Basophils Relative: 1 %
Eosinophils Absolute: 0.1 10*3/uL (ref 0.0–0.5)
Eosinophils Relative: 1 %
HCT: 42.7 % (ref 36.0–46.0)
Hemoglobin: 14.2 g/dL (ref 12.0–15.0)
Immature Granulocytes: 0 %
Lymphocytes Relative: 28 %
Lymphs Abs: 2.1 10*3/uL (ref 0.7–4.0)
MCH: 28.7 pg (ref 26.0–34.0)
MCHC: 33.3 g/dL (ref 30.0–36.0)
MCV: 86.4 fL (ref 80.0–100.0)
Monocytes Absolute: 0.5 10*3/uL (ref 0.1–1.0)
Monocytes Relative: 7 %
Neutro Abs: 4.7 10*3/uL (ref 1.7–7.7)
Neutrophils Relative %: 63 %
Platelets: 187 10*3/uL (ref 150–400)
RBC: 4.94 MIL/uL (ref 3.87–5.11)
RDW: 13.5 % (ref 11.5–15.5)
WBC: 7.5 10*3/uL (ref 4.0–10.5)
nRBC: 0 % (ref 0.0–0.2)

## 2020-12-27 LAB — HEMOGLOBIN A1C
Hgb A1c MFr Bld: 6.6 % — ABNORMAL HIGH (ref 4.8–5.6)
Mean Plasma Glucose: 142.72 mg/dL

## 2020-12-27 LAB — COMPREHENSIVE METABOLIC PANEL
ALT: 38 U/L (ref 0–44)
AST: 33 U/L (ref 15–41)
Albumin: 4.2 g/dL (ref 3.5–5.0)
Alkaline Phosphatase: 56 U/L (ref 38–126)
Anion gap: 9 (ref 5–15)
BUN: 14 mg/dL (ref 6–20)
CO2: 28 mmol/L (ref 22–32)
Calcium: 9.4 mg/dL (ref 8.9–10.3)
Chloride: 104 mmol/L (ref 98–111)
Creatinine, Ser: 0.81 mg/dL (ref 0.44–1.00)
GFR, Estimated: >60 mL/min (ref >60)
Glucose, Bld: 103 mg/dL — ABNORMAL HIGH (ref 70–99)
Potassium: 4.4 mmol/L (ref 3.5–5.1)
Sodium: 141 mmol/L (ref 135–145)
Total Bilirubin: 0.7 mg/dL (ref 0.3–1.2)
Total Protein: 7.9 g/dL (ref 6.5–8.1)

## 2020-12-27 LAB — GLUCOSE, CAPILLARY: Glucose-Capillary: 107 mg/dL — ABNORMAL HIGH (ref 70–99)

## 2020-12-28 ENCOUNTER — Other Ambulatory Visit: Payer: Self-pay | Admitting: General Surgery

## 2020-12-29 LAB — SARS CORONAVIRUS 2 (TAT 6-24 HRS): SARS Coronavirus 2: NEGATIVE

## 2020-12-31 NOTE — Anesthesia Preprocedure Evaluation (Addendum)
Anesthesia Evaluation  Patient identified by MRN, date of birth, ID band Patient awake    Reviewed: Allergy & Precautions, NPO status , Patient's Chart, lab work & pertinent test results  History of Anesthesia Complications (+) PONV and history of anesthetic complications  Airway Mallampati: II  TM Distance: >3 FB Neck ROM: Full    Dental no notable dental hx.    Pulmonary sleep apnea ,    Pulmonary exam normal        Cardiovascular hypertension, Pt. on medications Normal cardiovascular exam     Neuro/Psych Anxiety negative neurological ROS     GI/Hepatic Neg liver ROS, GERD  Medicated and Controlled,  Endo/Other  diabetesMorbid obesity (BMI 45)  Renal/GU negative Renal ROS  negative genitourinary   Musculoskeletal negative musculoskeletal ROS (+)   Abdominal   Peds  Hematology Hgb 14.2   Anesthesia Other Findings Day of surgery medications reviewed with patient.  Reproductive/Obstetrics negative OB ROS                            Anesthesia Physical Anesthesia Plan  ASA: 3  Anesthesia Plan: General   Post-op Pain Management:    Induction: Intravenous  PONV Risk Score and Plan: 4 or greater and Treatment may vary due to age or medical condition, Ondansetron, Dexamethasone, Midazolam, Scopolamine patch - Pre-op, TIVA, Propofol infusion and Aprepitant  Airway Management Planned: Oral ETT  Additional Equipment: None  Intra-op Plan:   Post-operative Plan: Extubation in OR  Informed Consent: I have reviewed the patients History and Physical, chart, labs and discussed the procedure including the risks, benefits and alternatives for the proposed anesthesia with the patient or authorized representative who has indicated his/her understanding and acceptance.     Dental advisory given  Plan Discussed with: CRNA  Anesthesia Plan Comments:        Anesthesia Quick  Evaluation

## 2020-12-31 NOTE — H&P (Signed)
PROVIDER: Andres Vest Leanne Chang, MD  MRN: [id number] DOB: [birth year] DATE OF ENCOUNTER: 12/27/2020 Subjective   Chief Complaint: pre-op for surgery   History of Present Illness: [name] is a 50 y.o. female who is seen today for follow-up regarding her severe obesity and related comorbidities which include obstructive sleep apnea on CPAP, GERD, prediabetes and plantar fasciitis.  Initially met her in May of this year to discuss bariatric surgery. One of her biggest concerns is reflux issues. Her dad has Barrett's esophagus and she has longstanding reflux herself.  She describes her reflux is having to clear her throat after eating and an occasional acid sensation at night. No pain with swallowing. She has been on Protonix for 18 years. She had an upper endoscopy about 5 years ago by Dr. Lyndel Safe. She has had a cholecystectomy.  Her sister does have von Willebrand's disease. The patient states that she herself had no bleeding issues during her cholecystectomy or 2 C-sections..  Her H. pylori breath test was positive on August 8. She completed her H. pylori treatment about 7 days ago.  Chest x-ray in May was unremarkable.  Upper GI in May was reviewed which showed a normal upper GI. EKG was unremarkable  She had a case of shingles back in August and got treatment.  She takes HCTZ for Mnire's disease  She has received nutritional and psychological clearance  Review of Systems: A complete review of systems was obtained from the patient. I have reviewed this information and discussed as appropriate with the patient. See HPI as well for other ROS.  ROS   Medical History: Past Medical History:  Diagnosis Date   Anxiety   GERD (gastroesophageal reflux disease)   Sleep apnea   There is no problem list on file for this patient.  Past Surgical History:  Procedure Laterality Date   CESAREAN SECTION   deviated spetum   gallbladder removed    Allergies  Allergen Reactions    Shellfish Containing Products Hives   Shellfish Derived Hives and Rash   Current Outpatient Medications on File Prior to Visit  Medication Sig Dispense Refill   hydroCHLOROthiazide (HYDRODIURIL) 12.5 MG tablet hydrochlorothiazide 12.5 mg tablet   acetaminophen (TYLENOL) 500 MG tablet Take by mouth   clonazePAM (KLONOPIN) 0.5 MG tablet Take by mouth   loratadine (CLARITIN) 10 mg tablet Take 10 mg by mouth once daily   meclizine (ANTIVERT) 25 mg tablet Take by mouth   metroNIDAZOLE (METROCREAM) 0.75 % cream metronidazole 0.75 % topical cream   montelukast (SINGULAIR) 10 mg tablet montelukast 10 mg tablet   pantoprazole (PROTONIX) 40 MG DR tablet   No current facility-administered medications on file prior to visit.   Family History  Problem Relation Age of Onset   Obesity Mother   High blood pressure (Hypertension) Mother   Hyperlipidemia (Elevated cholesterol) Father   Heart valve disease Father   Obesity Sister   High blood pressure (Hypertension) Sister    Social History   Tobacco Use  Smoking Status Never Smoker  Smokeless Tobacco Never Used    Social History   Socioeconomic History   Marital status: Married  Tobacco Use   Smoking status: Never Smoker   Smokeless tobacco: Never Used  Scientific laboratory technician Use: Never used  Substance and Sexual Activity   Alcohol use: Never   Drug use: Never   Objective:   Vitals:  12/27/20 0928  BP: (!) 148/90  Pulse: (!) 115  Temp: 36.8 C (98.3 F)  SpO2: 96%  Weight: (!) 115.5 kg (254 lb 9.6 oz)  Height: 160 cm ($Remov'5\' 3"'HygSWc$ )   Body mass index is 45.1 kg/m.  Gen: alert, NAD, non-toxic appearing; severe obesity Pupils: equal, no scleral icterus Pulm: Lungs clear to auscultation, symmetric chest rise CV: regular rate and rhythm Abd: soft, nontender, nondistended.  Ext: no edema,  Skin: no rash, no jaundice  Labs, Imaging and Diagnostic Testing:  Labs from March of this year were reviewed. Normal CBC, normal c-Met. A1c  6.4. Normal TSH. Lipid panel from March 2021 was within normal limits except for a low HDL at 36  Assessment and Plan:  Diagnoses and all orders for this visit:  Severe obesity (CMS-HCC)  Gastroesophageal reflux disease, unspecified whether esophagitis present  Prediabetes  OSA on CPAP  Plantar fasciitis  History of Helicobacter pylori infection  Meniere disease, unspecified laterality  Low HDL (under 40)  Family history of von Willebrand disease    We reviewed her labs and her work-up. We discussed her upper GI and imaging. She has attended her preop education class. We rediscussed the typical hospitalization and the typical recovery. We discussed the typical quirks that we see after surgery. We discussed the common complications that we can see after surgery. We discussed our leak rate, bleed rate, readmission. All of her questions were asked and answered.  She again confirmed that she has had no bleeding issues with her cholecystectomy and 2 C-sections.  This patient encounter took 25 minutes today to perform the following: take history, perform exam, review outside records, interpret imaging, counsel the patient on their diagnosis and document encounter, findings & plan in the EHR  No follow-ups on file.  Leighton Ruff. Redmond Pulling MD FACS General, Minimally Invasive, & Bariatric Surgery Electronically signed by Rudean Curt, MD at 12/27/2020 10:04 AM EDT

## 2021-01-01 ENCOUNTER — Encounter (HOSPITAL_COMMUNITY)
Admission: RE | Disposition: A | Discharge status: Home / Self Care | Payer: Self-pay | Source: Ambulatory Visit | Attending: General Surgery

## 2021-01-01 ENCOUNTER — Inpatient Hospital Stay (HOSPITAL_COMMUNITY): Payer: BC Managed Care – PPO | Admitting: Anesthesiology

## 2021-01-01 ENCOUNTER — Inpatient Hospital Stay (HOSPITAL_COMMUNITY)
Admission: RE | Admit: 2021-01-01 | Discharge: 2021-01-03 | DRG: 621 | Disposition: A | Discharge status: Home / Self Care | Payer: BC Managed Care – PPO | Source: Ambulatory Visit | Attending: General Surgery | Admitting: General Surgery

## 2021-01-01 ENCOUNTER — Encounter (HOSPITAL_COMMUNITY): Payer: Self-pay | Admitting: General Surgery

## 2021-01-01 DIAGNOSIS — Z79899 Other long term (current) drug therapy: Secondary | ICD-10-CM | POA: Diagnosis not present

## 2021-01-01 DIAGNOSIS — Z6841 Body Mass Index (BMI) 40.0 and over, adult: Secondary | ICD-10-CM | POA: Diagnosis not present

## 2021-01-01 DIAGNOSIS — R7303 Prediabetes: Secondary | ICD-10-CM | POA: Diagnosis present

## 2021-01-01 DIAGNOSIS — G4730 Sleep apnea, unspecified: Secondary | ICD-10-CM | POA: Diagnosis present

## 2021-01-01 DIAGNOSIS — Z8249 Family history of ischemic heart disease and other diseases of the circulatory system: Secondary | ICD-10-CM

## 2021-01-01 DIAGNOSIS — Z91013 Allergy to seafood: Secondary | ICD-10-CM | POA: Diagnosis not present

## 2021-01-01 DIAGNOSIS — G4733 Obstructive sleep apnea (adult) (pediatric): Secondary | ICD-10-CM | POA: Diagnosis present

## 2021-01-01 DIAGNOSIS — Z8619 Personal history of other infectious and parasitic diseases: Secondary | ICD-10-CM

## 2021-01-01 DIAGNOSIS — K219 Gastro-esophageal reflux disease without esophagitis: Secondary | ICD-10-CM | POA: Diagnosis present

## 2021-01-01 DIAGNOSIS — H8109 Meniere's disease, unspecified ear: Secondary | ICD-10-CM | POA: Diagnosis present

## 2021-01-01 DIAGNOSIS — M722 Plantar fascial fibromatosis: Secondary | ICD-10-CM | POA: Diagnosis not present

## 2021-01-01 DIAGNOSIS — F419 Anxiety disorder, unspecified: Secondary | ICD-10-CM | POA: Diagnosis present

## 2021-01-01 DIAGNOSIS — Z9884 Bariatric surgery status: Secondary | ICD-10-CM

## 2021-01-01 DIAGNOSIS — Z9989 Dependence on other enabling machines and devices: Secondary | ICD-10-CM | POA: Diagnosis not present

## 2021-01-01 HISTORY — PX: GASTRIC ROUX-EN-Y: SHX5262

## 2021-01-01 HISTORY — DX: Bariatric surgery status: Z98.84

## 2021-01-01 LAB — TYPE AND SCREEN
ABO/RH(D): O POS
Antibody Screen: NEGATIVE

## 2021-01-01 LAB — PREGNANCY, URINE: Preg Test, Ur: NEGATIVE

## 2021-01-01 LAB — HEMOGLOBIN AND HEMATOCRIT, BLOOD
HCT: 43.7 % (ref 36.0–46.0)
Hemoglobin: 14.5 g/dL (ref 12.0–15.0)

## 2021-01-01 LAB — GLUCOSE, CAPILLARY: Glucose-Capillary: 136 mg/dL — ABNORMAL HIGH (ref 70–99)

## 2021-01-01 SURGERY — LAPAROSCOPIC ROUX-EN-Y GASTRIC BYPASS WITH UPPER ENDOSCOPY
Anesthesia: General | Site: Abdomen

## 2021-01-01 MED ORDER — CHLORHEXIDINE GLUCONATE 0.12 % MT SOLN
15.0000 mL | Freq: Once | OROMUCOSAL | Status: AC
  Administered 2021-01-01: 15 mL via OROMUCOSAL

## 2021-01-01 MED ORDER — ONDANSETRON HCL 4 MG/2ML IJ SOLN
INTRAMUSCULAR | Status: AC
  Filled 2021-01-01: qty 2

## 2021-01-01 MED ORDER — BUPIVACAINE LIPOSOME 1.3 % IJ SUSP
INTRAMUSCULAR | Status: DC | PRN
  Administered 2021-01-01: 20 mL

## 2021-01-01 MED ORDER — SCOPOLAMINE 1 MG/3DAYS TD PT72: 1.0000 | MEDICATED_PATCH | Freq: Once | TRANSDERMAL | Status: DC

## 2021-01-01 MED ORDER — HEPARIN SODIUM (PORCINE) 5000 UNIT/ML IJ SOLN
5000.0000 [IU] | INTRAMUSCULAR | Status: AC
  Administered 2021-01-01: 5000 [IU] via SUBCUTANEOUS
  Filled 2021-01-01: qty 1

## 2021-01-01 MED ORDER — HYDRALAZINE HCL 20 MG/ML IJ SOLN: 10.0000 mg | INTRAMUSCULAR | Status: DC | PRN

## 2021-01-01 MED ORDER — ACETAMINOPHEN 500 MG PO TABS
1000.0000 mg | ORAL_TABLET | Freq: Three times a day (TID) | ORAL | Status: DC
  Administered 2021-01-01 – 2021-01-03 (×6): 1000 mg via ORAL
  Filled 2021-01-01 (×6): qty 2

## 2021-01-01 MED ORDER — LIDOCAINE HCL (PF) 2 % IJ SOLN
INTRAMUSCULAR | Status: AC
  Filled 2021-01-01: qty 10

## 2021-01-01 MED ORDER — DIPHENHYDRAMINE HCL 50 MG/ML IJ SOLN: 12.5000 mg | Freq: Three times a day (TID) | INTRAMUSCULAR | Status: DC | PRN

## 2021-01-01 MED ORDER — MIDAZOLAM HCL 2 MG/2ML IJ SOLN
INTRAMUSCULAR | Status: AC
  Filled 2021-01-01: qty 2

## 2021-01-01 MED ORDER — MORPHINE SULFATE (PF) 2 MG/ML IV SOLN: 1.0000 mg | INTRAVENOUS | Status: DC | PRN

## 2021-01-01 MED ORDER — ROCURONIUM BROMIDE 10 MG/ML (PF) SYRINGE
PREFILLED_SYRINGE | INTRAVENOUS | Status: DC | PRN
  Administered 2021-01-01: 60 mg via INTRAVENOUS
  Administered 2021-01-01: 20 mg via INTRAVENOUS

## 2021-01-01 MED ORDER — ROCURONIUM BROMIDE 10 MG/ML (PF) SYRINGE
PREFILLED_SYRINGE | INTRAVENOUS | Status: AC
  Filled 2021-01-01: qty 10

## 2021-01-01 MED ORDER — ACETAMINOPHEN 500 MG PO TABS: 1000.0000 mg | ORAL_TABLET | Freq: Once | ORAL | Status: DC

## 2021-01-01 MED ORDER — ONDANSETRON HCL 4 MG/2ML IJ SOLN
INTRAMUSCULAR | Status: DC | PRN
  Administered 2021-01-01: 4 mg via INTRAVENOUS

## 2021-01-01 MED ORDER — GABAPENTIN 100 MG PO CAPS
200.0000 mg | ORAL_CAPSULE | Freq: Two times a day (BID) | ORAL | Status: DC
  Administered 2021-01-01 – 2021-01-03 (×4): 200 mg via ORAL
  Filled 2021-01-01 (×4): qty 2

## 2021-01-01 MED ORDER — SODIUM CHLORIDE (PF) 0.9 % IJ SOLN
INTRAMUSCULAR | Status: DC | PRN
  Administered 2021-01-01: 50 mL

## 2021-01-01 MED ORDER — ORAL CARE MOUTH RINSE
15.0000 mL | Freq: Once | OROMUCOSAL | Status: AC
Start: 2021-01-01 — End: 2021-01-01

## 2021-01-01 MED ORDER — LACTATED RINGERS IV SOLN: INTRAVENOUS | Status: DC

## 2021-01-01 MED ORDER — FENTANYL CITRATE (PF) 250 MCG/5ML IJ SOLN
INTRAMUSCULAR | Status: AC
  Filled 2021-01-01: qty 5

## 2021-01-01 MED ORDER — DEXAMETHASONE SODIUM PHOSPHATE 4 MG/ML IJ SOLN: 4.0000 mg | INTRAMUSCULAR | Status: DC

## 2021-01-01 MED ORDER — LIDOCAINE HCL (CARDIAC) PF 100 MG/5ML IV SOSY
PREFILLED_SYRINGE | INTRAVENOUS | Status: DC | PRN
  Administered 2021-01-01: 100 mg via INTRAVENOUS

## 2021-01-01 MED ORDER — ACETAMINOPHEN 160 MG/5ML PO SOLN: 1000.0000 mg | Freq: Three times a day (TID) | ORAL | Status: DC

## 2021-01-01 MED ORDER — 0.9 % SODIUM CHLORIDE (POUR BTL) OPTIME
TOPICAL | Status: DC | PRN
  Administered 2021-01-01: 1000 mL

## 2021-01-01 MED ORDER — OXYCODONE HCL 5 MG/5ML PO SOLN
5.0000 mg | Freq: Once | ORAL | Status: DC | PRN
Start: 2021-01-01 — End: 2021-01-01

## 2021-01-01 MED ORDER — "VISTASEAL 4 ML SINGLE DOSE KIT "
4.0000 mL | PACK | Freq: Once | CUTANEOUS | Status: AC
  Administered 2021-01-01: 4 mL via TOPICAL
  Filled 2021-01-01: qty 4

## 2021-01-01 MED ORDER — KCL IN DEXTROSE-NACL 20-5-0.45 MEQ/L-%-% IV SOLN
INTRAVENOUS | Status: DC
  Filled 2021-01-01 (×6): qty 1000

## 2021-01-01 MED ORDER — PROMETHAZINE HCL 25 MG/ML IJ SOLN
6.2500 mg | INTRAMUSCULAR | Status: DC | PRN
Start: 2021-01-01 — End: 2021-01-01

## 2021-01-01 MED ORDER — ACETAMINOPHEN 500 MG PO TABS
1000.0000 mg | ORAL_TABLET | ORAL | Status: AC
  Administered 2021-01-01: 1000 mg via ORAL
  Filled 2021-01-01: qty 2

## 2021-01-01 MED ORDER — SODIUM CHLORIDE 0.9 % IV SOLN
2.0000 g | INTRAVENOUS | Status: AC
  Administered 2021-01-01: 2 g via INTRAVENOUS
  Filled 2021-01-01: qty 2

## 2021-01-01 MED ORDER — PHENYLEPHRINE 40 MCG/ML (10ML) SYRINGE FOR IV PUSH (FOR BLOOD PRESSURE SUPPORT)
PREFILLED_SYRINGE | INTRAVENOUS | Status: DC | PRN
  Administered 2021-01-01: 80 ug via INTRAVENOUS

## 2021-01-01 MED ORDER — KETAMINE HCL 10 MG/ML IJ SOLN
INTRAMUSCULAR | Status: DC | PRN
  Administered 2021-01-01: 30 mg via INTRAVENOUS
  Administered 2021-01-01: 10 mg via INTRAVENOUS

## 2021-01-01 MED ORDER — ENSURE MAX PROTEIN PO LIQD
2.0000 [oz_av] | ORAL | Status: DC
  Administered 2021-01-02 – 2021-01-03 (×9): 2 [oz_av] via ORAL

## 2021-01-01 MED ORDER — BUPIVACAINE LIPOSOME 1.3 % IJ SUSP
INTRAMUSCULAR | Status: AC
  Filled 2021-01-01: qty 20

## 2021-01-01 MED ORDER — CHLORHEXIDINE GLUCONATE 4 % EX LIQD: 60.0000 mL | Freq: Once | CUTANEOUS | Status: DC

## 2021-01-01 MED ORDER — APREPITANT 40 MG PO CAPS
40.0000 mg | ORAL_CAPSULE | ORAL | Status: AC
  Administered 2021-01-01: 40 mg via ORAL
  Filled 2021-01-01: qty 1

## 2021-01-01 MED ORDER — FIBRIN SEALANT 2 ML SINGLE DOSE KIT
2.0000 mL | PACK | Freq: Once | CUTANEOUS | Status: AC
  Administered 2021-01-01: 2 mL via TOPICAL
  Filled 2021-01-01: qty 2

## 2021-01-01 MED ORDER — APREPITANT 40 MG PO CAPS: 40.0000 mg | ORAL_CAPSULE | Freq: Once | ORAL | Status: DC

## 2021-01-01 MED ORDER — DEXAMETHASONE SODIUM PHOSPHATE 4 MG/ML IJ SOLN
INTRAMUSCULAR | Status: DC | PRN
  Administered 2021-01-01: 8 mg via INTRAVENOUS

## 2021-01-01 MED ORDER — BUPIVACAINE LIPOSOME 1.3 % IJ SUSP: 20.0000 mL | Freq: Once | INTRAMUSCULAR | Status: DC

## 2021-01-01 MED ORDER — FENTANYL CITRATE PF 50 MCG/ML IJ SOSY
25.0000 ug | PREFILLED_SYRINGE | INTRAMUSCULAR | Status: DC | PRN
  Administered 2021-01-01 (×3): 50 ug via INTRAVENOUS

## 2021-01-01 MED ORDER — GABAPENTIN 300 MG PO CAPS
300.0000 mg | ORAL_CAPSULE | ORAL | Status: AC
  Administered 2021-01-01: 300 mg via ORAL
  Filled 2021-01-01: qty 1

## 2021-01-01 MED ORDER — FENTANYL CITRATE PF 50 MCG/ML IJ SOSY
PREFILLED_SYRINGE | INTRAMUSCULAR | Status: AC
  Filled 2021-01-01: qty 2

## 2021-01-01 MED ORDER — PROPOFOL 10 MG/ML IV BOLUS
INTRAVENOUS | Status: DC | PRN
  Administered 2021-01-01: 200 mg via INTRAVENOUS

## 2021-01-01 MED ORDER — PHENYLEPHRINE 40 MCG/ML (10ML) SYRINGE FOR IV PUSH (FOR BLOOD PRESSURE SUPPORT)
PREFILLED_SYRINGE | INTRAVENOUS | Status: AC
  Filled 2021-01-01: qty 10

## 2021-01-01 MED ORDER — LACTATED RINGERS IR SOLN
Status: DC | PRN
  Administered 2021-01-01: 1000 mL

## 2021-01-01 MED ORDER — SCOPOLAMINE 1 MG/3DAYS TD PT72
1.0000 | MEDICATED_PATCH | TRANSDERMAL | Status: DC
  Administered 2021-01-01: 1.5 mg via TRANSDERMAL
  Filled 2021-01-01: qty 1

## 2021-01-01 MED ORDER — LIDOCAINE HCL (PF) 2 % IJ SOLN
INTRAMUSCULAR | Status: DC | PRN
Start: 2021-01-01 — End: 2021-01-01
  Administered 2021-01-01: 1.5 mg/kg/h via INTRADERMAL

## 2021-01-01 MED ORDER — FENTANYL CITRATE PF 50 MCG/ML IJ SOSY
PREFILLED_SYRINGE | INTRAMUSCULAR | Status: AC
  Filled 2021-01-01: qty 1

## 2021-01-01 MED ORDER — SUGAMMADEX SODIUM 200 MG/2ML IV SOLN
INTRAVENOUS | Status: DC | PRN
  Administered 2021-01-01: 200 mg via INTRAVENOUS

## 2021-01-01 MED ORDER — PANTOPRAZOLE SODIUM 40 MG IV SOLR
40.0000 mg | Freq: Every day | INTRAVENOUS | Status: DC
  Administered 2021-01-01 – 2021-01-02 (×2): 40 mg via INTRAVENOUS
  Filled 2021-01-01 (×2): qty 40

## 2021-01-01 MED ORDER — DEXAMETHASONE SODIUM PHOSPHATE 10 MG/ML IJ SOLN
INTRAMUSCULAR | Status: AC
  Filled 2021-01-01: qty 1

## 2021-01-01 MED ORDER — KETAMINE HCL 10 MG/ML IJ SOLN
INTRAMUSCULAR | Status: AC
  Filled 2021-01-01: qty 1

## 2021-01-01 MED ORDER — MIDAZOLAM HCL 5 MG/5ML IJ SOLN
INTRAMUSCULAR | Status: DC | PRN
  Administered 2021-01-01: 2 mg via INTRAVENOUS

## 2021-01-01 MED ORDER — ENOXAPARIN SODIUM 30 MG/0.3ML IJ SOSY
30.0000 mg | PREFILLED_SYRINGE | Freq: Two times a day (BID) | INTRAMUSCULAR | Status: DC
  Administered 2021-01-01 – 2021-01-03 (×4): 30 mg via SUBCUTANEOUS
  Filled 2021-01-01 (×4): qty 0.3

## 2021-01-01 MED ORDER — OXYCODONE HCL 5 MG/5ML PO SOLN
ORAL | Status: AC
  Administered 2021-01-01: 5 mg via ORAL
  Filled 2021-01-01: qty 5

## 2021-01-01 MED ORDER — OXYCODONE HCL 5 MG PO TABS: 5.0000 mg | ORAL_TABLET | Freq: Once | ORAL | Status: DC | PRN

## 2021-01-01 MED ORDER — LIDOCAINE HCL (PF) 2 % IJ SOLN
INTRAMUSCULAR | Status: AC
  Filled 2021-01-01: qty 5

## 2021-01-01 MED ORDER — OXYCODONE HCL 5 MG/5ML PO SOLN
5.0000 mg | Freq: Four times a day (QID) | ORAL | Status: DC | PRN
  Administered 2021-01-01 – 2021-01-02 (×3): 5 mg via ORAL
  Filled 2021-01-01 (×3): qty 5

## 2021-01-01 MED ORDER — SODIUM CHLORIDE 0.9 % IV SOLN
12.5000 mg | Freq: Four times a day (QID) | INTRAVENOUS | Status: DC | PRN
  Filled 2021-01-01: qty 0.5

## 2021-01-01 MED ORDER — STERILE WATER FOR IRRIGATION IR SOLN
Status: DC | PRN
  Administered 2021-01-01: 2000 mL

## 2021-01-01 MED ORDER — FENTANYL CITRATE (PF) 100 MCG/2ML IJ SOLN
INTRAMUSCULAR | Status: DC | PRN
  Administered 2021-01-01: 100 ug via INTRAVENOUS
  Administered 2021-01-01: 25 ug via INTRAVENOUS

## 2021-01-01 MED ORDER — SIMETHICONE 80 MG PO CHEW
80.0000 mg | CHEWABLE_TABLET | Freq: Four times a day (QID) | ORAL | Status: DC | PRN
  Administered 2021-01-03: 80 mg via ORAL
  Filled 2021-01-01: qty 1

## 2021-01-01 MED ORDER — ONDANSETRON HCL 4 MG/2ML IJ SOLN
4.0000 mg | Freq: Four times a day (QID) | INTRAMUSCULAR | Status: DC | PRN
Start: 2021-01-01 — End: 2021-01-03

## 2021-01-01 SURGICAL SUPPLY — 85 items
APL LAPSCP 35 DL APL RGD (MISCELLANEOUS) ×2
APL PRP STRL LF DISP 70% ISPRP (MISCELLANEOUS) ×2
APL SWBSTK 6 STRL LF DISP (MISCELLANEOUS)
APPLICATOR COTTON TIP 6 STRL (MISCELLANEOUS) IMPLANT
APPLICATOR COTTON TIP 6IN STRL (MISCELLANEOUS)
APPLICATOR VISTASEAL 35 (MISCELLANEOUS) ×4 IMPLANT
APPLIER CLIP ROT 13.4 12 LRG (CLIP)
APR CLP LRG 13.4X12 ROT 20 MLT (CLIP)
BAG COUNTER SPONGE SURGICOUNT (BAG) IMPLANT
BAG SPNG CNTER NS LX DISP (BAG)
BLADE SURG SZ11 CARB STEEL (BLADE) ×2 IMPLANT
BNDG ADH 1X3 SHEER STRL LF (GAUZE/BANDAGES/DRESSINGS) IMPLANT
BNDG ADH THN 3X1 STRL LF (GAUZE/BANDAGES/DRESSINGS)
CABLE HIGH FREQUENCY MONO STRZ (ELECTRODE) IMPLANT
CHLORAPREP W/TINT 26 (MISCELLANEOUS) ×4 IMPLANT
CLIP APPLIE ROT 13.4 12 LRG (CLIP) IMPLANT
CLIP SUT LAPRA TY ABSORB (SUTURE) ×4 IMPLANT
CUTTER FLEX LINEAR 45M (STAPLE) IMPLANT
DEVICE SUT QUICK LOAD TK 5 (STAPLE) IMPLANT
DEVICE SUT TI-KNOT TK 5X26 (MISCELLANEOUS) IMPLANT
DEVICE SUTURE ENDOST 10MM (ENDOMECHANICALS) ×2 IMPLANT
DRAIN PENROSE 0.25X18 (DRAIN) ×2 IMPLANT
DRSG TEGADERM 2-3/8X2-3/4 SM (GAUZE/BANDAGES/DRESSINGS) ×2 IMPLANT
ELECT REM PT RETURN 15FT ADLT (MISCELLANEOUS) ×2 IMPLANT
GAUZE 4X4 16PLY ~~LOC~~+RFID DBL (SPONGE) ×2 IMPLANT
GAUZE SPONGE 2X2 8PLY STRL LF (GAUZE/BANDAGES/DRESSINGS) ×1 IMPLANT
GAUZE SPONGE 4X4 12PLY STRL (GAUZE/BANDAGES/DRESSINGS) IMPLANT
GLOVE SRG 8 PF TXTR STRL LF DI (GLOVE) ×1 IMPLANT
GLOVE SURG POLY ORTHO LF SZ7.5 (GLOVE) ×2 IMPLANT
GLOVE SURG UNDER POLY LF SZ8 (GLOVE) ×2
GOWN STRL REUS W/TWL XL LVL3 (GOWN DISPOSABLE) ×8 IMPLANT
IRRIG SUCT STRYKERFLOW 2 WTIP (MISCELLANEOUS) ×2
IRRIGATION SUCT STRKRFLW 2 WTP (MISCELLANEOUS) ×1 IMPLANT
KIT BASIN OR (CUSTOM PROCEDURE TRAY) ×2 IMPLANT
KIT GASTRIC LAVAGE 34FR ADT (SET/KITS/TRAYS/PACK) ×2 IMPLANT
KIT TURNOVER KIT A (KITS) ×2 IMPLANT
MARKER SKIN DUAL TIP RULER LAB (MISCELLANEOUS) ×2 IMPLANT
MAT PREVALON FULL STRYKER (MISCELLANEOUS) ×2 IMPLANT
NDL SPNL 22GX3.5 QUINCKE BK (NEEDLE) ×1 IMPLANT
NEEDLE SPNL 22GX3.5 QUINCKE BK (NEEDLE) ×2 IMPLANT
PACK CARDIOVASCULAR III (CUSTOM PROCEDURE TRAY) ×2 IMPLANT
PENCIL SMOKE EVACUATOR (MISCELLANEOUS) IMPLANT
RELOAD 45 VASCULAR/THIN (ENDOMECHANICALS) IMPLANT
RELOAD ENDO STITCH 2.0 (ENDOMECHANICALS) ×18
RELOAD STAPLE 45 2.5 WHT GRN (ENDOMECHANICALS) IMPLANT
RELOAD STAPLE 45 3.5 BLU ETS (ENDOMECHANICALS) IMPLANT
RELOAD STAPLE 60 2.6 WHT THN (STAPLE) ×2 IMPLANT
RELOAD STAPLE 60 3.6 BLU REG (STAPLE) ×2 IMPLANT
RELOAD STAPLE 60 3.8 GOLD REG (STAPLE) ×1 IMPLANT
RELOAD STAPLE TA45 3.5 REG BLU (ENDOMECHANICALS) IMPLANT
RELOAD STAPLER BLUE 60MM (STAPLE) ×5 IMPLANT
RELOAD STAPLER GOLD 60MM (STAPLE) IMPLANT
RELOAD STAPLER WHITE 60MM (STAPLE) ×2 IMPLANT
RELOAD SUT SNGL STCH ABSRB 2-0 (ENDOMECHANICALS) ×5 IMPLANT
RELOAD SUT SNGL STCH BLK 2-0 (ENDOMECHANICALS) ×4 IMPLANT
SCISSORS LAP 5X45 EPIX DISP (ENDOMECHANICALS) ×2 IMPLANT
SET TUBE SMOKE EVAC HIGH FLOW (TUBING) ×2 IMPLANT
SHEARS HARMONIC ACE PLUS 45CM (MISCELLANEOUS) ×2 IMPLANT
SLEEVE XCEL OPT CAN 5 100 (ENDOMECHANICALS) ×2 IMPLANT
SOL ANTI FOG 6CC (MISCELLANEOUS) ×1 IMPLANT
SOLUTION ANTI FOG 6CC (MISCELLANEOUS) ×1
SPONGE GAUZE 2X2 STER 10/PKG (GAUZE/BANDAGES/DRESSINGS) ×1
STAPLER ECHELON BIOABSB 60 FLE (MISCELLANEOUS) IMPLANT
STAPLER ECHELON LONG 60 440 (INSTRUMENTS) ×2 IMPLANT
STAPLER RELOAD BLUE 60MM (STAPLE) ×10
STAPLER RELOAD GOLD 60MM (STAPLE)
STAPLER RELOAD WHITE 60MM (STAPLE) ×4
STRIP CLOSURE SKIN 1/2X4 (GAUZE/BANDAGES/DRESSINGS) ×12 IMPLANT
SURGILUBE 2OZ TUBE FLIPTOP (MISCELLANEOUS) ×2 IMPLANT
SUT MNCRL AB 4-0 PS2 18 (SUTURE) ×2 IMPLANT
SUT RELOAD ENDO STITCH 2 48X1 (ENDOMECHANICALS) ×5
SUT RELOAD ENDO STITCH 2.0 (ENDOMECHANICALS) ×4
SUT SURGIDAC NAB ES-9 0 48 120 (SUTURE) IMPLANT
SUT VIC AB 2-0 SH 27 (SUTURE) ×2
SUT VIC AB 2-0 SH 27X BRD (SUTURE) ×1 IMPLANT
SUTURE RELOAD END STTCH 2 48X1 (ENDOMECHANICALS) ×5 IMPLANT
SUTURE RELOAD ENDO STITCH 2.0 (ENDOMECHANICALS) ×4 IMPLANT
SYR 20ML LL LF (SYRINGE) ×4 IMPLANT
TOWEL OR 17X26 10 PK STRL BLUE (TOWEL DISPOSABLE) ×2 IMPLANT
TOWEL OR NON WOVEN STRL DISP B (DISPOSABLE) ×2 IMPLANT
TRAY FOLEY MTR SLVR 16FR STAT (SET/KITS/TRAYS/PACK) IMPLANT
TROCAR BLADELESS OPT 5 100 (ENDOMECHANICALS) ×2 IMPLANT
TROCAR UNIVERSAL OPT 12M 100M (ENDOMECHANICALS) ×6 IMPLANT
TROCAR XCEL 12X100 BLDLESS (ENDOMECHANICALS) ×2 IMPLANT
TUBING CONNECTING 10 (TUBING) ×4 IMPLANT

## 2021-01-01 NOTE — Progress Notes (Signed)

## 2021-01-01 NOTE — Interval H&P Note (Signed)
History and Physical Interval Note:  01/01/2021 7:28 AM  [name]  has presented today for surgery, with the diagnosis of MORBID OBESITY.  The various methods of treatment have been discussed with the patient and family. After consideration of risks, benefits and other options for treatment, the patient has consented to  Procedure(s): LAPAROSCOPIC ROUX-EN-Y GASTRIC BYPASS WITH UPPER ENDOSCOPY (N/A) as a surgical intervention.  The patient's history has been reviewed, patient examined, no change in status, stable for surgery.  I have reviewed the patient's chart and labs.  Questions were answered to the patient's satisfaction.     Gaynelle Adu

## 2021-01-01 NOTE — Anesthesia Procedure Notes (Signed)
Procedure Name: Intubation Date/Time: 01/01/2021 7:40 AM Performed by: Caren Macadam, CRNA Pre-anesthesia Checklist: Patient identified, Emergency Drugs available, Suction available and Patient being monitored Patient Re-evaluated:Patient Re-evaluated prior to induction Oxygen Delivery Method: Circle system utilized Preoxygenation: Pre-oxygenation with 100% oxygen Induction Type: IV induction Ventilation: Mask ventilation without difficulty Laryngoscope Size: Miller and 2 Grade View: Grade I Tube type: Oral Tube size: 7.0 mm Number of attempts: 1 Airway Equipment and Method: Stylet Placement Confirmation: ETT inserted through vocal cords under direct vision, positive ETCO2 and breath sounds checked- equal and bilateral Secured at: 23 cm Tube secured with: Tape Dental Injury: Teeth and Oropharynx as per pre-operative assessment

## 2021-01-01 NOTE — Anesthesia Postprocedure Evaluation (Signed)
Anesthesia Post Note  Patient: Darian B Gammage  Procedure(s) Performed: LAPAROSCOPIC ROUX-EN-Y GASTRIC BYPASS WITH UPPER ENDOSCOPY (Abdomen)     Patient location during evaluation: PACU Anesthesia Type: General Level of consciousness: awake and alert and oriented Pain management: pain level controlled Vital Signs Assessment: post-procedure vital signs reviewed and stable Respiratory status: spontaneous breathing, nonlabored ventilation and respiratory function stable Cardiovascular status: blood pressure returned to baseline Postop Assessment: no apparent nausea or vomiting Anesthetic complications: no   No notable events documented.  Last Vitals:  Vitals:   01/01/21 1030 01/01/21 1045  BP: (!) 146/86 (!) 149/78  Pulse: 90 85  Resp: 13 11  Temp:  36.8 C  SpO2: 97% 100%    Last Pain:  Vitals:   01/01/21 1045  TempSrc:   PainSc: 4                  Shanda Howells

## 2021-01-01 NOTE — Op Note (Signed)
[name] [id number] 1970/07/28. 01/01/2021  Preoperative diagnosis:  Severe obesity (BMI 44)  Gastroesophageal reflux disease, unspecified whether esophagitis present  Prediabetes  OSA on CPAP  Plantar fasciitis  History of Helicobacter pylori infection  Meniere disease, unspecified laterality  Low HDL (under 40)  Family history of von Willebrand disease  Postoperative  diagnosis:  1. same  Surgical procedure: Laparoscopic Roux-en-Y gastric bypass (ante-colic, ante-gastric); upper endoscopy  Surgeon: [name], M.D. FACS  Asst.: Michelle Rossetti MD FACS  Anesthesia: General plus exparel/marcaine mix  Complications: None   EBL: Minimal   Drains: None   Disposition: PACU in good condition   Indications for procedure: 50 y.o. yo female with morbid obesity who has been unsuccessful at sustained weight loss. The patient's comorbidities are listed above. We discussed the risk and benefits of surgery including but not limited to anesthesia risk, bleeding, infection, blood clot formation, anastomotic leak, anastomotic stricture, ulcer formation, death, respiratory complications, intestinal blockage, internal hernia, gallstone formation, vitamin and nutritional deficiencies, injury to surrounding structures, failure to lose weight and mood changes.   Description of procedure: Patient is brought to the operating room and general anesthesia induced. The patient had received preoperative broad-spectrum IV antibiotics and subcutaneous heparin. The abdomen was widely sterilely prepped with Chloraprep and draped. Patient timeout was performed and correct patient and procedure confirmed. Access was obtained with a 12 mm Optiview trocar in the left upper quadrant and pneumoperitoneum established without difficulty. Under direct vision 12 mm trocars were placed laterally in the right upper quadrant, right upper quadrant midclavicular line, and to the left and above the umbilicus for  the camera port. A 5 mm trocar was placed laterally in the left upper quadrant.  Exparel/marcaine mix was infiltrated in bilateral lateral abdominal walls as a TAP block.  The omentum was brought into the upper abdomen and the transverse mesocolon elevated and the ligament of Treitz clearly identified. A 50 cm biliopancreatic limb was then carefully measured from the ligament of Treitz. The small intestine was divided at this point with a single firing of the white load linear stapler. A Penrose drain was sutured to the end of the Roux-en-Y limb for later identification. A 100 cm Roux-en-Y limb was then carefully measured. At this point a side-to-side anastomosis was created between the Roux limb and the end of the biliopancreatic limb. This was accomplished with a single firing of the 60 mm white load linear stapler. The common enterotomy was closed with a running 2-0 Vicryl begun at either end of the enterotomy and tied centrally. Vistaseal tissue sealant was placed over the anastomosis. The mesenteric defect was then closed with running 2-0 silk. The omentum was then divided with the harmonic scalpel up towards the transverse colon to allow mobility of the Roux limb toward the gastric pouch. The patient was then placed in steep reversed Trendelenburg. Through a 5 mm subxiphoid site the [facility] retractor was placed and the left lobe of the liver elevated with excellent exposure of the upper stomach and hiatus. The angle of Hiss was then mobilized with the harmonic scalpel. A 5 cm gastric pouch was then carefully measured along the lesser curve of the stomach. Dissection was carried along the lesser curve at this point with the Harmonic scalpel working carefully back toward the lesser sac at right angles to the lesser curve. The free lesser sac was then entered. After being sure all tubes were removed from the stomach an initial firing of the blue load 60  mm linear stapler was fired at right angles across the  lesser curve for about 4 cm. The gastric pouch was further mobilized posteriorly and then the pouch was completed with 3 further firings of the 60 mm blue load linear stapler up through the previously dissected angle of His. It was ensured that the pouch was completely mobilized away from the gastric remnant. This created a nice tubular 4-5 cm gastric pouch. The Roux limb was then brought up in an antecolic fashion with the candycane facing to the patient's left without undue tension. The gastrojejunostomy was created with an initial posterior row of 2-0 Vicryl between the Roux limb and the staple line of the gastric pouch. Enterotomies were then made in the gastric pouch and the Roux limb with the harmonic scalpel and at approximately 2-2-1/2 cm anastomosis was created with a single firing of the 56mm blue load linear stapler. The staple line was inspected and was intact without bleeding. The common enterotomy was then closed with running 2-0 Vicryl begun at either end and tied centrally. The Ewall tube was then easily passed through the anastomosis and an outer anterior layer of running 2-0 Vicryl was placed. The Ewald tube was removed. With the outlet of the gastrojejunostomy clamped and under saline irrigation the assistant performed upper endoscopy and with the gastric pouch tensely distended with air-there was no evidence of leak on this test. The pouch was desufflated. The Vonita Moss defect was closed with running 2-0 silk. The abdomen was inspected for any evidence of bleeding or bowel injury and everything looked fine. The Nathanson retractor was removed under direct vision after coating the anastomosis with Vistaseal tissue sealant. All CO2 was evacuated and trochars removed. Skin incisions were closed with 4-0 monocryl in a subcuticular fashion followed by  steri-strips and bandages. Sponge needle and instrument counts were correct. The patient was taken to the PACU in good condition.    [name]. Michelle Campanile,  MD, FACS General, Bariatric, & Minimally Invasive Surgery [facility], [place]

## 2021-01-01 NOTE — Op Note (Signed)
Preoperative diagnosis: Roux-en-Y gastric bypass  Postoperative diagnosis: Same   Procedure: Upper endoscopy   Surgeon: Feliciana Rossetti, M.D.  Anesthesia: Gen.   Indications for procedure: This patient was undergoing a Roux-en-Y gastric bypass.   Description of procedure: The endoscopy was placed in the mouth and into the oropharynx and under endoscopic vision it was advanced to the esophagogastric junction.  The stomach was insufflated and no bleeding or bubbles were seen.  The GEJ was identified at 35cm from the teeth. The anastomosis was identified at 40 cm. No bleeding or leaks were detected. The scope was withdrawn without difficulty.    Feliciana Rossetti, M.D. General, Bariatric, & Minimally Invasive Surgery [facility] [place]

## 2021-01-01 NOTE — Transfer of Care (Signed)
Immediate Anesthesia Transfer of Care Note  Patient: [name]  Procedure(s) Performed: LAPAROSCOPIC ROUX-EN-Y GASTRIC BYPASS WITH UPPER ENDOSCOPY (Abdomen)  Patient Location: PACU  Anesthesia Type:General  Level of Consciousness: drowsy  Airway & Oxygen Therapy: Patient Spontanous Breathing and Patient connected to face mask oxygen  Post-op Assessment: Report given to RN and Post -op Vital signs reviewed and stable  Post vital signs: Reviewed and stable  Last Vitals:  Vitals Value Taken Time  BP 143/82 01/01/21 1000  Temp 36.9 C 01/01/21 1000  Pulse 88 01/01/21 1002  Resp 18 01/01/21 1002  SpO2 100 % 01/01/21 1002  Vitals shown include unvalidated device data.  Last Pain:  Vitals:   01/01/21 1000  TempSrc:   PainSc: Asleep         Complications: No notable events documented.

## 2021-01-01 NOTE — Progress Notes (Signed)
PHARMACY CONSULT FOR:  Risk Assessment for Post-Discharge VTE Following Bariatric Surgery  Post-Discharge VTE Risk Assessment: This patient's probability of 30-day post-discharge VTE is increased due to the factors marked:   Female    Age >/=60 years    BMI >/=50 kg/m2    CHF    Dyspnea at Rest    Paraplegia  X  Non-gastric-band surgery    Operation Time >/=3 hr    Return to OR     Length of Stay >/= 3 d   Hx of VTE   Hypercoagulable condition   Significant venous stasis     Predicted probability of 30-day post-discharge VTE: 0.16%  Other patient-specific factors to consider:   Recommendation for Discharge: No pharmacologic prophylaxis post-discharge    [name] is a 50 y.o. female who underwent laparoscopic roux en y gastric bypass on 01/01/2021   Case start: 8:03 Case end: 9:53   Allergies  Allergen Reactions   Shellfish Allergy Hives    Patient Measurements: Height: 5\' 3"  (160 cm) Weight: 113.6 kg (250 lb 6.4 oz) IBW/kg (Calculated) : 52.4 Body mass index is 44.36 kg/m.  Recent Labs    01/01/21 1008  HGB 14.5  HCT 43.7   Estimated Creatinine Clearance: 103.1 mL/min (by C-G formula based on SCr of 0.81 mg/dL).    Past Medical History:  Diagnosis Date   Gestational diabetes    Meniere disease    PONV (postoperative nausea and vomiting)    Prediabetes    Sleep apnea      Medications Prior to Admission  Medication Sig Dispense Refill Last Dose   acetaminophen (TYLENOL) 500 MG tablet Take 1,000 mg by mouth every 6 (six) hours as needed for moderate pain.   12/31/2020   clonazePAM (KLONOPIN) 0.5 MG tablet Take 0.5 mg by mouth daily as needed for anxiety.   01/01/2021 at 0400   hydrochlorothiazide (MICROZIDE) 12.5 MG capsule Take 12.5 mg by mouth daily.   12/31/2020   ibuprofen (ADVIL) 200 MG tablet Take 400 mg by mouth every 6 (six) hours as needed for moderate pain or headache.   Past Month   loratadine (CLARITIN) 10 MG tablet Take 10 mg by  mouth daily.   12/31/2020   meclizine (ANTIVERT) 25 MG tablet Take 25 mg by mouth 3 (three) times daily as needed for dizziness.   Past Month   metroNIDAZOLE (METROCREAM) 0.75 % cream Apply 1 application topically 2 (two) times daily.   12/31/2020   montelukast (SINGULAIR) 10 MG tablet Take 10 mg by mouth daily.   12/31/2020   pantoprazole (PROTONIX) 40 MG tablet Take 40 mg by mouth daily.   12/31/2020     03/02/2021 PharmD, BCPS Clinical Pharmacist [facility] main pharmacy [phone] 01/01/2021 2:37 PM

## 2021-01-02 ENCOUNTER — Encounter (HOSPITAL_COMMUNITY): Payer: Self-pay | Admitting: General Surgery

## 2021-01-02 LAB — COMPREHENSIVE METABOLIC PANEL
ALT: 37 U/L (ref 0–44)
AST: 32 U/L (ref 15–41)
Albumin: 3.7 g/dL (ref 3.5–5.0)
Alkaline Phosphatase: 45 U/L (ref 38–126)
Anion gap: 7 (ref 5–15)
BUN: 8 mg/dL (ref 6–20)
CO2: 23 mmol/L (ref 22–32)
Calcium: 8.4 mg/dL — ABNORMAL LOW (ref 8.9–10.3)
Chloride: 106 mmol/L (ref 98–111)
Creatinine, Ser: 0.58 mg/dL (ref 0.44–1.00)
GFR, Estimated: >60 mL/min (ref >60)
Glucose, Bld: 187 mg/dL — ABNORMAL HIGH (ref 70–99)
Potassium: 4 mmol/L (ref 3.5–5.1)
Sodium: 136 mmol/L (ref 135–145)
Total Bilirubin: 0.8 mg/dL (ref 0.3–1.2)
Total Protein: 7 g/dL (ref 6.5–8.1)

## 2021-01-02 LAB — CBC WITH DIFFERENTIAL/PLATELET
Abs Immature Granulocytes: 0.06 10*3/uL (ref 0.00–0.07)
Basophils Absolute: 0 10*3/uL (ref 0.0–0.1)
Basophils Relative: 0 %
Eosinophils Absolute: 0 10*3/uL (ref 0.0–0.5)
Eosinophils Relative: 0 %
HCT: 39.7 % (ref 36.0–46.0)
Hemoglobin: 13 g/dL (ref 12.0–15.0)
Immature Granulocytes: 1 %
Lymphocytes Relative: 13 %
Lymphs Abs: 1.3 10*3/uL (ref 0.7–4.0)
MCH: 28.6 pg (ref 26.0–34.0)
MCHC: 32.7 g/dL (ref 30.0–36.0)
MCV: 87.3 fL (ref 80.0–100.0)
Monocytes Absolute: 0.7 10*3/uL (ref 0.1–1.0)
Monocytes Relative: 7 %
Neutro Abs: 8.4 10*3/uL — ABNORMAL HIGH (ref 1.7–7.7)
Neutrophils Relative %: 79 %
Platelets: 201 10*3/uL (ref 150–400)
RBC: 4.55 MIL/uL (ref 3.87–5.11)
RDW: 13.5 % (ref 11.5–15.5)
WBC: 10.5 10*3/uL (ref 4.0–10.5)
nRBC: 0 % (ref 0.0–0.2)

## 2021-01-02 MED ORDER — MECLIZINE HCL 25 MG PO TABS: 25.0000 mg | ORAL_TABLET | Freq: Three times a day (TID) | ORAL | Status: DC | PRN

## 2021-01-02 MED ORDER — CLONAZEPAM 0.5 MG PO TABS
0.5000 mg | ORAL_TABLET | Freq: Two times a day (BID) | ORAL | Status: DC | PRN
  Administered 2021-01-02: 0.5 mg via ORAL
  Filled 2021-01-02: qty 1

## 2021-01-02 MED ORDER — POLYETHYLENE GLYCOL 3350 17 G PO PACK
17.0000 g | PACK | Freq: Every day | ORAL | Status: DC | PRN
  Administered 2021-01-02: 17 g via ORAL
  Filled 2021-01-02: qty 1

## 2021-01-02 NOTE — Progress Notes (Signed)
Nutrition Education Note ° °Received consult for diet education for patient s/p bariatric surgery. ° °Discussed 2 week post op diet with pt. Emphasized that liquids must be non carbonated, non caffeinated, and sugar free. Fluid goals discussed. Pt to follow up with outpatient bariatric RD for further diet progression after 2 weeks. Multivitamins and minerals also reviewed. Teach back method used, pt expressed understanding, expect good compliance. ° °If nutrition issues arise, please consult RD. ° °Michelle Traore, MS, RD, LDN °Inpatient Clinical Dietitian °Contact information available via Amion ° ° °

## 2021-01-02 NOTE — Discharge Instructions (Signed)

## 2021-01-02 NOTE — Progress Notes (Signed)
1 Day Post-Op   Subjective/Chief Complaint: Some epigastric discomfort at baseline and with activity Nausea better Having some anxiety at times and request her home anxiety med Nervous about going home today since lives a ways from here ambulated   Objective: Vital signs in last 24 hours: Temp:  [97.6 F (36.4 C)-98.8 F (37.1 C)] 98.8 F (37.1 C) (10/11 1323) Pulse Rate:  [55-86] 68 (10/11 1323) Resp:  [16-18] 18 (10/11 1303) BP: (109-134)/(58-73) 118/71 (10/11 1323) SpO2:  [94 %-100 %] 100 % (10/11 1323) Last BM Date: 12/29/20  Intake/Output from previous day: 10/10 0701 - 10/11 0700 In: 1779.7 [I.V.:1679.7; IV Piggyback:100] Out: 1825 [Urine:1800; Blood:25] Intake/Output this shift: Total I/O In: 120 [P.O.:120] Out: 800 [Urine:800]  Alert, nontoxic Symm chest rise Reg Soft, expected mild TTP, incisions ok No edema  Lab Results:  Recent Labs    01/01/21 1008 01/02/21 0356  WBC  --  10.5  HGB 14.5 13.0  HCT 43.7 39.7  PLT  --  201   BMET Recent Labs    01/02/21 0356  NA 136  K 4.0  CL 106  CO2 23  GLUCOSE 187*  BUN 8  CREATININE 0.58  CALCIUM 8.4*   PT/INR No results for input(s): LABPROT, INR in the last 72 hours. ABG No results for input(s): PHART, HCO3 in the last 72 hours.  Invalid input(s): PCO2, PO2  Studies/Results: No results found.  Anti-infectives: Anti-infectives (From admission, onward)    Start     Dose/Rate Route Frequency Ordered Stop   01/01/21 0600  cefoTEtan (CEFOTAN) 2 g in sodium chloride 0.9 % 100 mL IVPB        2 g 200 mL/hr over 30 Minutes Intravenous On call to O.R. 01/01/21 0509 01/01/21 0811       Assessment/Plan: s/p Procedure(s): LAPAROSCOPIC ROUX-EN-Y GASTRIC BYPASS WITH UPPER ENDOSCOPY (N/A) Severe obesity (BMI 44)  Gastroesophageal reflux disease, unspecified whether esophagitis present  Prediabetes  OSA on CPAP  Plantar fasciitis  History of Helicobacter pylori infection  Meniere disease,  unspecified laterality  Low HDL (under 40)  Family history of von Willebrand disease  Doing well No fever. No tachy. No wbc. No signs of bleeding Discussed epigastric discomfort - several reasons- not atypical  Reports no BM for 3 days prior to surgery - will start prn miralax Since pt lives remote from hospital, will keep this evening  LOS: 1 day    Gaynelle Adu 01/02/2021

## 2021-01-02 NOTE — Progress Notes (Signed)
Patient alert and oriented, pain is controlled. Patient is tolerating fluids, advanced to protein shake today, patient is tolerating well. Reviewed Gastric Bypass discharge instructions with patient and patient is able to articulate understanding. Provided information on BELT program, Support Group and [facility] outpatient pharmacy. All questions answered, will continue to monitor.    

## 2021-01-03 LAB — CBC WITH DIFFERENTIAL/PLATELET
Abs Immature Granulocytes: 0.05 10*3/uL (ref 0.00–0.07)
Basophils Absolute: 0 10*3/uL (ref 0.0–0.1)
Basophils Relative: 0 %
Eosinophils Absolute: 0.1 10*3/uL (ref 0.0–0.5)
Eosinophils Relative: 1 %
HCT: 40.1 % (ref 36.0–46.0)
Hemoglobin: 12.7 g/dL (ref 12.0–15.0)
Immature Granulocytes: 1 %
Lymphocytes Relative: 34 %
Lymphs Abs: 3.1 10*3/uL (ref 0.7–4.0)
MCH: 28.3 pg (ref 26.0–34.0)
MCHC: 31.7 g/dL (ref 30.0–36.0)
MCV: 89.5 fL (ref 80.0–100.0)
Monocytes Absolute: 0.7 10*3/uL (ref 0.1–1.0)
Monocytes Relative: 7 %
Neutro Abs: 5.3 10*3/uL (ref 1.7–7.7)
Neutrophils Relative %: 57 %
Platelets: 188 10*3/uL (ref 150–400)
RBC: 4.48 MIL/uL (ref 3.87–5.11)
RDW: 14 % (ref 11.5–15.5)
WBC: 9.3 10*3/uL (ref 4.0–10.5)
nRBC: 0 % (ref 0.0–0.2)

## 2021-01-03 MED ORDER — ONDANSETRON 4 MG PO TBDP: 4.0000 mg | ORAL_TABLET | Freq: Four times a day (QID) | ORAL | 0 refills | Status: AC | PRN

## 2021-01-03 MED ORDER — TRAMADOL HCL 50 MG PO TABS: 50.0000 mg | ORAL_TABLET | Freq: Four times a day (QID) | ORAL | 0 refills | Status: DC | PRN

## 2021-01-03 MED ORDER — ACETAMINOPHEN 500 MG PO TABS: 1000.0000 mg | ORAL_TABLET | Freq: Three times a day (TID) | ORAL | 0 refills | Status: AC

## 2021-01-03 NOTE — Progress Notes (Signed)
24hr fluid recall prior to discharge .  Per dehydration protocol,  will call pt to f/u within one week post op.  Patient was able to meet clear liquid goals prior to beginning protein.

## 2021-01-03 NOTE — Progress Notes (Signed)
Written d/c instructions reviewed per Randa Lynn, RN and all questions answered. She verbalized understanding. Pt d/c per w/c w all belongings in stable condition.

## 2021-01-05 ENCOUNTER — Telehealth (HOSPITAL_COMMUNITY): Payer: Self-pay | Admitting: *Deleted

## 2021-01-05 NOTE — Discharge Summary (Signed)
Physician Discharge Summary  [name]:[id number] DOB: 06-01-1970 DOA: 01/01/2021  PCP: Alinda Deem, MD  Admit date: 01/01/2021 Discharge date: 01/03/2021  Recommendations for Outpatient Follow-up:     Follow-up Information     Gaynelle Adu, MD. Go on 01/26/2021.   Specialty: General Surgery Why: at 10:15am.  Please arrive 15 minutes prior to your appointment time.  Thank you. Contact information: [address] [place] [address] [phone]         Hedda Slade, PA-C. Go on 02/23/2021.   Specialty: General Surgery Why: at 9:15 am for Dr. Andrey Campanile.  Please arrive 15 minutes prior to your appointment time.  Thank you. Contact information: [address] [place] [address] [phone]                Discharge Diagnoses:  Active Problems:   Gastric bypass status for obesity Severe obesity (BMI 44)  Gastroesophageal reflux disease, unspecified whether esophagitis present  Prediabetes  OSA on CPAP  Plantar fasciitis  History of Helicobacter pylori infection  Meniere disease, unspecified laterality  Low HDL (under 40)  Family history of von Willebrand disease  Surgical Procedure: Laparoscopic Roux-en-Y gastric bypass, upper endoscopy  Discharge Condition: Good Disposition: Home  Diet recommendation: Postoperative gastric bypass diet  Filed Weights   01/01/21 0607  Weight: 113.6 kg     Hospital Course:  The patient was admitted for a planned laparoscopic Roux-en-Y gastric bypass. Please see operative note. Preoperatively the patient was given 5000 units of subcutaneous heparin for DVT prophylaxis. ERAS protocol was used. Postoperative prophylactic Lovenox dosing was started on the evening of postoperative day 0.  The patient was started on ice chips and water on the evening of POD 0 which they tolerated. On postoperative day 1 The patient's diet was advanced to protein shakes which they also tolerated. On POD 2, The  patient was ambulating without difficulty. Their vital signs are stable without fever or tachycardia. Their hemoglobin had remained stable. The patient was maintained on their home settings for CPAP therapy. The patient had received discharge instructions and counseling. They were deemed stable for discharge.  BP (!) 117/47 (BP Location: Left Arm)   Pulse 76   Temp 97.9 F (36.6 C)   Resp 15   Ht 5\' 3"  (1.6 m)   Wt 113.6 kg   LMP 10/23/2020 (Within Weeks) Comment: urine preg.negative, periods irregular, spotting every few months.  SpO2 100%   BMI 44.36 kg/m   Gen: alert, NAD, non-toxic appearing Pupils: equal, no scleral icterus Pulm: Lungs clear to auscultation, symmetric chest rise CV: regular rate and rhythm Abd: soft, min tender, nondistended. No cellulitis. No incisional hernia Ext: no edema, no calf tenderness Skin: no rash, no jaundice  Discharge Instructions  Discharge Instructions     Ambulate hourly while awake   Complete by: As directed    Call MD for:  difficulty breathing, headache or visual disturbances   Complete by: As directed    Call MD for:  persistant dizziness or light-headedness   Complete by: As directed    Call MD for:  persistant nausea and vomiting   Complete by: As directed    Call MD for:  redness, tenderness, or signs of infection (pain, swelling, redness, odor or green/yellow discharge around incision site)   Complete by: As directed    Call MD for:  severe uncontrolled pain   Complete by: As directed    Call MD for:  temperature >101  F   Complete by: As directed    Diet bariatric full liquid   Complete by: As directed    Discharge instructions   Complete by: As directed    See bariatric discharge instructions   Incentive spirometry   Complete by: As directed    Perform hourly while awake      Allergies as of 01/03/2021       Reactions   Shellfish Allergy Hives        Medication List     STOP taking these medications     ibuprofen 200 MG tablet Commonly known as: ADVIL       TAKE these medications    acetaminophen 500 MG tablet Commonly known as: TYLENOL Take 2 tablets (1,000 mg total) by mouth every 8 (eight) hours for 5 days. What changed:  when to take this reasons to take this   clonazePAM 0.5 MG tablet Commonly known as: KLONOPIN Take 0.5 mg by mouth daily as needed for anxiety.   hydrochlorothiazide 12.5 MG capsule Commonly known as: MICROZIDE Take 12.5 mg by mouth daily. Notes to patient: Monitor Blood Pressure Daily and keep a log for primary care physician.  Monitor for symptoms of dehydration.  You may need to make changes to your medications with rapid weight loss.     loratadine 10 MG tablet Commonly known as: CLARITIN Take 10 mg by mouth daily.   meclizine 25 MG tablet Commonly known as: ANTIVERT Take 25 mg by mouth 3 (three) times daily as needed for dizziness.   metroNIDAZOLE 0.75 % cream Commonly known as: METROCREAM Apply 1 application topically 2 (two) times daily.   montelukast 10 MG tablet Commonly known as: SINGULAIR Take 10 mg by mouth daily.   ondansetron 4 MG disintegrating tablet Commonly known as: ZOFRAN-ODT Take 1 tablet (4 mg total) by mouth every 6 (six) hours as needed for nausea or vomiting.   pantoprazole 40 MG tablet Commonly known as: PROTONIX Take 40 mg by mouth daily.   traMADol 50 MG tablet Commonly known as: ULTRAM Take 1 tablet (50 mg total) by mouth every 6 (six) hours as needed (pain).        Follow-up Information     Gaynelle Adu, MD. Go on 01/26/2021.   Specialty: General Surgery Why: at 10:15am.  Please arrive 15 minutes prior to your appointment time.  Thank you. Contact information: [address] [place] [address] [phone]         Hedda Slade, PA-C. Go on 02/23/2021.   Specialty: General Surgery Why: at 9:15 am for Dr. Andrey Campanile.  Please arrive 15 minutes prior to your appointment time.  Thank  you. Contact information: [address] [place] [address] [phone]                  The results of significant diagnostics from this hospitalization (including imaging, microbiology, ancillary and laboratory) are listed below for reference.    Significant Diagnostic Studies: No results found.  Labs: Basic Metabolic Panel: Recent Labs  Lab 01/02/21 0356  NA 136  K 4.0  CL 106  CO2 23  GLUCOSE 187*  BUN 8  CREATININE 0.58  CALCIUM 8.4*   Liver Function Tests: Recent Labs  Lab 01/02/21 0356  AST 32  ALT 37  ALKPHOS 45  BILITOT 0.8  PROT 7.0  ALBUMIN 3.7    CBC: Recent Labs  Lab 01/01/21 1008 01/02/21 0356 01/03/21 0426  WBC  --  10.5  9.3  NEUTROABS  --  8.4* 5.3  HGB 14.5 13.0 12.7  HCT 43.7 39.7 40.1  MCV  --  87.3 89.5  PLT  --  201 188    CBG: Recent Labs  Lab 01/01/21 0532  GLUCAP 136*    Active Problems:   Gastric bypass status for obesity   Time coordinating discharge: 15 min  Signed:  Atilano Ina, MD [facility], [place] [phone] 01/05/2021, 9:32 AM

## 2021-01-05 NOTE — Telephone Encounter (Signed)
1.  Tell me about your pain and pain management? Pt c/o soreness with movement and exertion.  Pt states that she has been taking pain medication. Medication helps to alleviate the discomfort.  2.  Let's talk about fluid intake.  How much total fluid are you taking in? Pt states that she is working to meet goal of 64 oz of fluid today.  Pt has consumed 40-45oz of fluid so far today.  Pt has consumed protein shakes and water.  Pt plans to drink rest of protein and water for the remainder of the day to meet goal.  3.  How much protein have you taken in the last 2 days? Pt states that she is working to meet goal of goal of 60g of protein today.  Pt has already consumed 45g of protein.  Pt plans to drink remainder of protein throughout the rest of the day to meet goal.  4.  Have you had nausea?  Tell me about when have experienced nausea and what you did to help? Pt currently denies nausea.  Pt states that she has taken nausea medication at least twice to alleviate discomfort.   5.  Has the frequency or color changed with your urine? Pt states that she is urinating "fine" with no changes in frequency or urgency.     6.  Tell me what your incisions look like? "Incisions look fine". Pt denies a fever, chills.  Pt states incisions are not swollen, open, or draining.  Pt encouraged to call [facility] if incisions change.   7.  Have you been passing gas? BM? Pt states that she is having BMs. Last BM 01/05/21.  Pt states that she has taken 5 doses of Miralax and Milk of Magnesium.  Pt instructed to be aware of the consistency of her stools and if they become too frequent or develops diarrhea to contact [facility].   8.  If a problem or question were to arise who would you call?  Do you know contact numbers for [facility], [facility], and [facility]? Pt denies dehydration symptoms.  Pt can describe s/sx of dehydration.   Pt states that she had a temp of 99.4 and HR in the 90's.  Pt instructed to increase I.S to every hour and to focus on  meeting fluid goals.  If develops a fever >100.4 or HR >100, to contact [facility]. Pt knows to call [facility] for surgical, [facility] for nutrition, and [facility] for non-urgent questions or concerns.   9.  How has the walking going? Pt states she is walking around and able to be active without difficulty.   10. Are you still using your incentive spirometer?  If so, how often? Pt states that she is using the I.S. at least every other hour. Pt encouraged to use incentive spirometer, at least 10x every hour while awake until she sees the surgeon.  11.  How are your vitamins and calcium going?  How are you taking them? Pt states that she is taking her supplements and vitamins without difficulty.  Reminded patient that the first 30 days post-operatively are important for successful recovery.  Practice good hand hygiene, wearing a mask when appropriate (since optional in most places), and minimizing exposure to people who live outside of the home, especially if they are exhibiting any respiratory, GI, or illness-like symptoms.

## 2021-01-16 ENCOUNTER — Encounter: Payer: BC Managed Care – PPO | Attending: General Surgery | Admitting: Skilled Nursing Facility1

## 2021-01-16 ENCOUNTER — Other Ambulatory Visit: Payer: Self-pay

## 2021-01-16 DIAGNOSIS — E669 Obesity, unspecified: Secondary | ICD-10-CM | POA: Diagnosis not present

## 2021-01-17 NOTE — Progress Notes (Signed)
2 Week Post-Operative Nutrition Class   Patient was seen on 01/16/2021 for Post-Operative Nutrition education at the [facility].    Surgery date: 01/01/2021 Surgery type: RYGB Start weight at [facility]: 262 Weight today: 261.3 pounds Bowel Habits: Every day to every other day no complaints   Body Composition Scale 01/16/2021  Current Body Weight 240.5  Total Body Fat % 45.6  Visceral Fat 16  Fat-Free Mass % 54.3   Total Body Water % 41.6  Muscle-Mass lbs 30.2  BMI 42.2  Body Fat Displacement          Torso  lbs 68         Left Leg  lbs 13.6         Right Leg  lbs 13.6         Left Arm  lbs 6.8         Right Arm   lbs 6.8      The following the learning objectives were met by the patient during this course: Identifies Phase 3 (Soft, High Proteins) Dietary Goals and will begin from 2 weeks post-operatively to 2 months post-operatively Identifies appropriate sources of fluids and proteins  Identifies appropriate fat sources and healthy verses unhealthy fat types   States protein recommendations and appropriate sources post-operatively Identifies the need for appropriate texture modifications, mastication, and bite sizes when consuming solids Identifies appropriate fat consumption and sources Identifies appropriate multivitamin and calcium sources post-operatively Describes the need for physical activity post-operatively and will follow MD recommendations States when to call healthcare provider regarding medication questions or post-operative complications   Handouts given during class include: Phase 3A: Soft, High Protein Diet Handout Phase 3 High Protein Meals Healthy Fats   Follow-Up Plan: Patient will follow-up at [facility] in 6 weeks for 2 month post-op nutrition visit for diet advancement per MD.

## 2021-01-22 ENCOUNTER — Telehealth: Payer: Self-pay | Admitting: Skilled Nursing Facility1

## 2021-01-22 NOTE — Telephone Encounter (Signed)
RD called pt to verify fluid intake once starting soft, solid proteins 2 week post-bariatric surgery.   Daily Fluid intake: 64 oz Daily Protein intake: 60 g Bowel Habits: with mirilax every 3 days   Concerns/issues:   No concerns voiced

## 2021-01-29 DIAGNOSIS — G4733 Obstructive sleep apnea (adult) (pediatric): Secondary | ICD-10-CM | POA: Diagnosis not present

## 2021-02-20 DIAGNOSIS — Z30431 Encounter for routine checking of intrauterine contraceptive device: Secondary | ICD-10-CM | POA: Diagnosis not present

## 2021-02-20 DIAGNOSIS — H9191 Unspecified hearing loss, right ear: Secondary | ICD-10-CM | POA: Diagnosis not present

## 2021-02-20 DIAGNOSIS — H8101 Meniere's disease, right ear: Secondary | ICD-10-CM | POA: Diagnosis not present

## 2021-03-07 ENCOUNTER — Encounter: Payer: BC Managed Care – PPO | Attending: General Surgery | Admitting: Skilled Nursing Facility1

## 2021-03-07 ENCOUNTER — Other Ambulatory Visit: Payer: Self-pay

## 2021-03-07 DIAGNOSIS — E669 Obesity, unspecified: Secondary | ICD-10-CM | POA: Diagnosis not present

## 2021-03-07 NOTE — Progress Notes (Signed)
Bariatric Nutrition Follow-Up Visit Medical Nutrition Therapy    NUTRITION ASSESSMENT   Surgery date: 01/01/2021 Surgery type: RYGB Start weight at [facility]: 262 Weight today: 222 pounds   Body Composition Scale 01/16/2021 03/07/2021  Current Body Weight 240.5 222  Total Body Fat % 45.6 43.2  Visceral Fat 16 14  Fat-Free Mass % 54.3 56.7   Total Body Water % 41.6 42.8  Muscle-Mass lbs 30.2 30.5  BMI 42.2 38.9  Body Fat Displacement           Torso  lbs 68 59.3         Left Leg  lbs 13.6 11.8         Right Leg  lbs 13.6 11.8         Left Arm  lbs 6.8 5.9         Right Arm   lbs 6.8 5.9   Clinical  Medical hx: prediabetes, sleep apnea  Medications: see list  Labs: not updated in EMR Notable signs/symptoms: none identified  Any previous deficiencies? No   Lifestyle & Dietary Hx  Pt states remembering to drink has been tough.   Pt states she does not really care for meat right now or fish  Estimated daily fluid intake: 50 oz Estimated daily protein intake: 60+ g Supplements: multi and calcium Current average weekly physical activity: walking 4 days a week 1 mile will add in tennis soon   24-Hr Dietary Recall First Meal: protein shake Snack:   Second Meal: cottage cheese + dry roasted edamame Snack:   Third Meal: chili with beans Snack: sugar free popcicle or protein hot chocolate  Beverages: water, decaf  Post-Op Goals/ Signs/ Symptoms Using straws: no Drinking while eating: no Chewing/swallowing difficulties: no Changes in vision: no Changes to mood/headaches: no Hair loss/changes to skin/nails: no Difficulty focusing/concentrating: no Sweating: no Limb weakness: no Dizziness/lightheadedness: no Palpitations: no  Carbonated/caffeinated beverages: no N/V/D/C/Gas: every 3 days hard stools Abdominal pain: no Dumping syndrome: no    NUTRITION DIAGNOSIS  Overweight/obesity (Movico-3.3) related to past poor dietary habits and physical inactivity as evidenced by  completed bariatric surgery and following dietary guidelines for continued weight loss and healthy nutrition status.     NUTRITION INTERVENTION Nutrition counseling (C-1) and education (E-2) to facilitate bariatric surgery goals, including: Diet advancement to the next phase (phase 4) now including non starchy vegetables The importance of consuming adequate calories as well as certain nutrients daily due to the body's need for essential vitamins, minerals, and fats The importance of daily physical activity and to reach a goal of at least 150 minutes of moderate to vigorous physical activity weekly (or as directed by their physician) due to benefits such as increased musculature and improved lab values The importance of intuitive eating specifically learning hunger-satiety cues and understanding the importance of learning a new body: The importance of mindful eating to avoid grazing behaviors   Handouts Provided Include  Phase 4  Learning Style & Readiness for Change Teaching method utilized: Visual & Auditory  Demonstrated degree of understanding via: Teach Back  Readiness Level: action Barriers to learning/adherence to lifestyle change: non identified   RD's Notes for Next Visit Assess adherence to pt chosen goals    MONITORING & EVALUATION Dietary intake, weekly physical activity, body weight Next Steps Patient is to follow-up in March

## 2021-03-14 ENCOUNTER — Ambulatory Visit: Payer: BC Managed Care – PPO | Admitting: Skilled Nursing Facility1

## 2021-04-03 DIAGNOSIS — Z1231 Encounter for screening mammogram for malignant neoplasm of breast: Secondary | ICD-10-CM | POA: Diagnosis not present

## 2021-04-09 DIAGNOSIS — Z9884 Bariatric surgery status: Secondary | ICD-10-CM | POA: Diagnosis not present

## 2021-04-09 DIAGNOSIS — R5383 Other fatigue: Secondary | ICD-10-CM | POA: Diagnosis not present

## 2021-06-05 ENCOUNTER — Encounter: Payer: BC Managed Care – PPO | Attending: General Surgery | Admitting: Skilled Nursing Facility1

## 2021-06-05 ENCOUNTER — Other Ambulatory Visit: Payer: Self-pay

## 2021-06-05 DIAGNOSIS — E669 Obesity, unspecified: Secondary | ICD-10-CM | POA: Diagnosis not present

## 2021-06-06 NOTE — Progress Notes (Signed)
Follow-up visit:  Post-Operative RYGB Surgery ? ?Medical Nutrition Therapy:  Appt start time: 6:00pm end time:  7:00pm ? ?Primary concerns today: Post-operative Bariatric Surgery Nutrition Management 6 Month Post-Op Class ? ?Surgery date: 01/01/2021 ?Surgery type: RYGB ?Start weight at [facility]: 262 ?Weight today: 222 pounds ?  ?Body Composition Scale 01/16/2021 03/07/2021 06/06/2021  ?Current Body Weight 240.5 222 190.5  ?Total Body Fat % 45.6 43.2 38.9  ?Visceral Fat 16 14 11   ?Fat-Free Mass % 54.3 56.7 61  ? Total Body Water % 41.6 42.8 45  ?Muscle-Mass lbs 30.2 30.5 30.2  ?BMI 42.2 38.9 33.3  ?Body Fat Displacement     ?       Torso  lbs 68 59.3 45.8  ?       Left Leg  lbs 13.6 11.8 9.1  ?       Right Leg  lbs 13.6 11.8 9.1  ?       Left Arm  lbs 6.8 5.9 4.5  ?       Right Arm   lbs 6.8 5.9 4.5  ? ?Clinical  ?Medical hx: prediabetes, sleep apnea  ?Medications: see list  ?Labs: not updated in EMR ?Notable signs/symptoms: none identified  ?Any previous deficiencies? No ? ? ? ?Information Reviewed/ Discussed During Appointment: ?-Review of composition scale numbers ?-Fluid requirements (64-100 ounces) ?-Protein requirements (60-80g) ?-Strategies for tolerating diet ?-Advancement of diet to include Starchy vegetables ?-Barriers to inclusion of new foods ?-Inclusion of appropriate multivitamin and calcium supplements  ?-Exercise recommendations  ? ?Fluid intake: adequate  ? ?Medications: See List ?Supplementation: appropriate  ? ? ?Using straws: no ?Drinking while eating: no ?Having you been chewing well: yes ?Chewing/swallowing difficulties: no ?Changes in vision: no ?Changes to mood/headaches: no ?Hair loss/Cahnges to skin/Changes to nails: no ?Any difficulty focusing or concentrating: no ?Sweating: no ?Dizziness/Lightheaded: no ?Palpitations: no  ?Carbonated beverages: no ?N/V/D/C/GAS: no ?Abdominal Pain: no ?Dumping syndrome: no ? ?Recent physical activity:  ADL's ? ?Progress Towards Goal(s):  In Progress ?Teaching  method utilized: Visual & Auditory  ?Demonstrated degree of understanding via: Teach Back  ?Readiness Level: Action ?Barriers to learning/adherence to lifestyle change: none identified ? ?Handouts given during visit include: ?Phase V diet Progression  ?Goals Sheet ?The Benefits of Exercise are endless..... ?Support Group Topics ? ? ?Teaching Method Utilized:  ?Visual ?Auditory ?Hands on ? ?Demonstrated degree of understanding via:  Teach Back  ? ?Monitoring/Evaluation:  Dietary intake, exercise, and body weight. Follow up in 3 months for 9 month post-op visit. ? ?

## 2021-06-15 DIAGNOSIS — J0100 Acute maxillary sinusitis, unspecified: Secondary | ICD-10-CM | POA: Diagnosis not present

## 2021-06-25 DIAGNOSIS — Z124 Encounter for screening for malignant neoplasm of cervix: Secondary | ICD-10-CM | POA: Diagnosis not present

## 2021-06-25 DIAGNOSIS — Z01419 Encounter for gynecological examination (general) (routine) without abnormal findings: Secondary | ICD-10-CM | POA: Diagnosis not present

## 2021-06-25 DIAGNOSIS — Z6833 Body mass index (BMI) 33.0-33.9, adult: Secondary | ICD-10-CM | POA: Diagnosis not present

## 2021-06-26 ENCOUNTER — Telehealth: Payer: Self-pay | Admitting: Skilled Nursing Facility1

## 2021-06-26 NOTE — Telephone Encounter (Signed)
Yes. Whatever brand you are currently taking you can just take the capsule option. Take it later in the day with food on your stomach to limit possible nausea.  ? ?From: [email or web address]>  ?Sent: Tuesday, June 26, 2021 3:26 PM ?To: Zacari [email or web address]>; Lamb, [email or web address]> ?Subject: Post surgery question ? ?*Caution - External email - see footer for warnings* ?Hi ladies, ? ?I am six months out from [facility] and have been doing one of the recommended bariatric multivitamins that is chewable. Am I able to switch to a capsule vitamin now?  ? ?Thanks, ? ?[name] ? ?

## 2021-07-18 DIAGNOSIS — R7303 Prediabetes: Secondary | ICD-10-CM | POA: Diagnosis not present

## 2021-07-18 DIAGNOSIS — E6609 Other obesity due to excess calories: Secondary | ICD-10-CM | POA: Diagnosis not present

## 2021-07-18 DIAGNOSIS — Z9884 Bariatric surgery status: Secondary | ICD-10-CM | POA: Diagnosis not present

## 2021-07-18 DIAGNOSIS — G4733 Obstructive sleep apnea (adult) (pediatric): Secondary | ICD-10-CM | POA: Diagnosis not present

## 2021-07-24 ENCOUNTER — Other Ambulatory Visit: Payer: Self-pay | Admitting: General Surgery

## 2021-07-24 DIAGNOSIS — R1033 Periumbilical pain: Secondary | ICD-10-CM

## 2021-08-09 ENCOUNTER — Telehealth: Payer: Self-pay | Admitting: Skilled Nursing Facility1

## 2021-08-09 NOTE — Telephone Encounter (Signed)
Hey [name] the plan was to discuss fruit at your next visit, you can make an earlier appt if you like, call [phone] to change your appt if you would like to add fruit sooner than your June appt.    Also, yes your friend was correct alcohol is 6 months out just be very careful you will not be able to tolerate as much as you could have before surgery. Be in a safe place ie around those you trust and a safe environment as you do not know how you will feel.   From: Brandolyn [email or web address]>  Sent: Thursday, Aug 09, 2021 11:22 AM To: Serena [email or web address]> Subject: Questions  *Caution - External email - see footer for warnings* Hi [name],  I am 7 months post op. Surgery was 01/01/21. When will I be able to have fruit? Specifically strawberries, watermelon and apples with peanut butter?  Also, when can I have alcohol? I thought it was 12 months but a friend who had surgery after me thought she heard six months in one of your classes so just want to verify.   Thanks,  [name]

## 2021-08-17 ENCOUNTER — Ambulatory Visit
Admission: RE | Admit: 2021-08-17 | Discharge: 2021-08-17 | Disposition: A | Discharge status: Home / Self Care | Payer: BC Managed Care – PPO | Source: Ambulatory Visit | Attending: General Surgery | Admitting: General Surgery

## 2021-08-17 DIAGNOSIS — Q433 Congenital malformations of intestinal fixation: Secondary | ICD-10-CM | POA: Diagnosis not present

## 2021-08-17 DIAGNOSIS — Z9889 Other specified postprocedural states: Secondary | ICD-10-CM | POA: Diagnosis not present

## 2021-08-17 DIAGNOSIS — R1033 Periumbilical pain: Secondary | ICD-10-CM

## 2021-08-17 DIAGNOSIS — Z9884 Bariatric surgery status: Secondary | ICD-10-CM | POA: Diagnosis not present

## 2021-08-17 DIAGNOSIS — N200 Calculus of kidney: Secondary | ICD-10-CM | POA: Diagnosis not present

## 2021-08-17 MED ORDER — IOPAMIDOL (ISOVUE-300) INJECTION 61%
100.0000 mL | Freq: Once | INTRAVENOUS | Status: AC | PRN
  Administered 2021-08-17: 100 mL via INTRAVENOUS

## 2021-09-06 ENCOUNTER — Encounter: Payer: Self-pay | Admitting: Skilled Nursing Facility1

## 2021-09-06 ENCOUNTER — Encounter: Payer: BC Managed Care – PPO | Attending: General Surgery | Admitting: Skilled Nursing Facility1

## 2021-09-06 DIAGNOSIS — E669 Obesity, unspecified: Secondary | ICD-10-CM | POA: Insufficient documentation

## 2021-09-06 NOTE — Progress Notes (Signed)
Follow-up visit:  Post-Operative RYGB Surgery  Surgery date: 01/01/2021 Surgery type: RYGB Start weight at [facility]: 262 Weight today: 178.1 pounds   Body Composition Scale 01/16/2021 03/07/2021 06/06/2021 09/06/2021  Current Body Weight 240.5 222 190.5 178.1  Total Body Fat % 45.6 43.2 38.9 36.7  Visceral Fat 16 14 11 10   Fat-Free Mass % 54.3 56.7 61 63.2   Total Body Water % 41.6 42.8 45 46.1  Muscle-Mass lbs 30.2 30.5 30.2 30  BMI 42.2 38.9 33.3 31.1  Body Fat Displacement             Torso  lbs 68 59.3 45.8 40.4         Left Leg  lbs 13.6 11.8 9.1 8         Right Leg  lbs 13.6 11.8 9.1 8         Left Arm  lbs 6.8 5.9 4.5 4         Right Arm   lbs 6.8 5.9 4.5 4   Clinical  Medical hx: prediabetes, sleep apnea  Medications: see list  Labs: not updated in EMR Notable signs/symptoms: none identified  Any previous deficiencies? No   Pt states she had a CT scan due to a kidney stone and also found constipation.     24-Hr Dietary Recall First Meal: sugar free coffee + granola with protein yogurt Snack:   Second Meal: cottage cheese + dry roasted edamame or chicken salad or wings or caulifllower crust pizza or bean burrito Snack:  popcorn Third Meal: salad and meat  Snack: sugar free popcicle or protein hot chocolate  Beverages: water, decaf coffee   Fluid intake: 50 ounces   Medications: See List Supplementation: appropriate    Using straws: no Drinking while eating: no Having you been chewing well: yes Chewing/swallowing difficulties: no Changes in vision: no Changes to mood/headaches: no Hair loss/Cahnges to skin/Changes to nails: no Any difficulty focusing or concentrating: no Sweating: no Dizziness/Lightheaded: no Palpitations: no  Carbonated beverages: no N/V/D/C/GAS: constipation Abdominal Pain: no Dumping syndrome: no  Recent physical activity:  playing on a tennis team, swimming, walking  Progress Towards Goal(s):  In Progress Teaching method  utilized: Visual & Auditory  Demonstrated degree of understanding via: Teach Back  Readiness Level: Action Barriers to learning/adherence to lifestyle change: none identified  Goals: Get to that 64 ounces of any sugar free fluid as long as you getting it in Add in fruit Ensure eating non starchy high fiber foods   Teaching Method Utilized:  Visual Auditory Hands on  Demonstrated degree of understanding via:  Teach Back   Monitoring/Evaluation:  Dietary intake, exercise, and body weight. Follow up in october

## 2021-09-12 DIAGNOSIS — H10021 Other mucopurulent conjunctivitis, right eye: Secondary | ICD-10-CM | POA: Diagnosis not present

## 2021-09-13 DIAGNOSIS — H05011 Cellulitis of right orbit: Secondary | ICD-10-CM | POA: Diagnosis not present

## 2021-09-27 DIAGNOSIS — E669 Obesity, unspecified: Secondary | ICD-10-CM | POA: Diagnosis not present

## 2021-09-27 DIAGNOSIS — F411 Generalized anxiety disorder: Secondary | ICD-10-CM | POA: Diagnosis not present

## 2021-09-27 DIAGNOSIS — Z6832 Body mass index (BMI) 32.0-32.9, adult: Secondary | ICD-10-CM | POA: Diagnosis not present

## 2021-11-21 IMAGING — RF DG UGI W SINGLE CM
10 of 16 series · 12 of 19 positions shown · non-contrast
Comparison: None.

CLINICAL DATA: 49-year-old female under preoperative evaluation
prior to potential bariatric surgery.

EXAM:
UPPER GI SERIES WITH KUB
TECHNIQUE: After obtaining a scout radiograph a routine upper GI series was
performed using thin barium
FLUOROSCOPY TIME:  Fluoroscopy Time:  48 seconds
Radiation Exposure Index (if provided by the fluoroscopic device):
56.9 mGy

[Series 1: t abdomen supine · 0.15mm/px · 1 of 1 slices shown (1 of 2)]
[im 1/1]
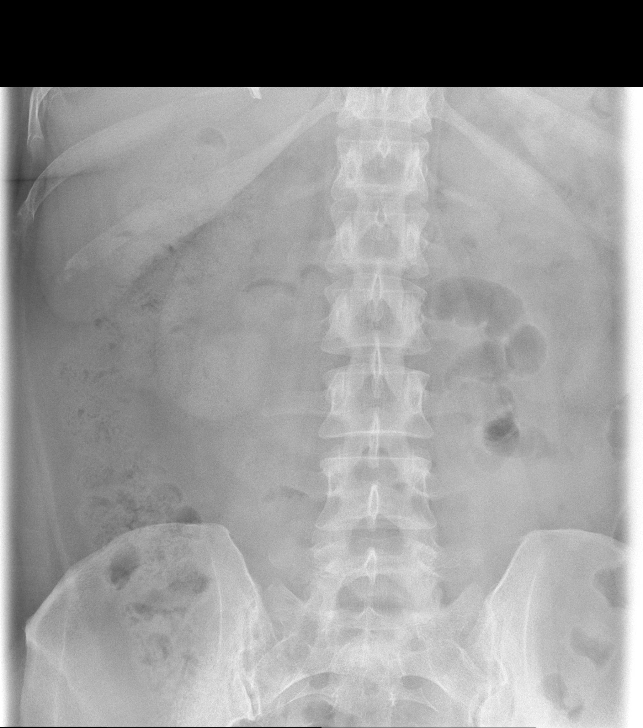

[Series 3: t abdomen supine · 0.15mm/px · 1 of 1 slices shown (2 of 2)]
[im 1/1]
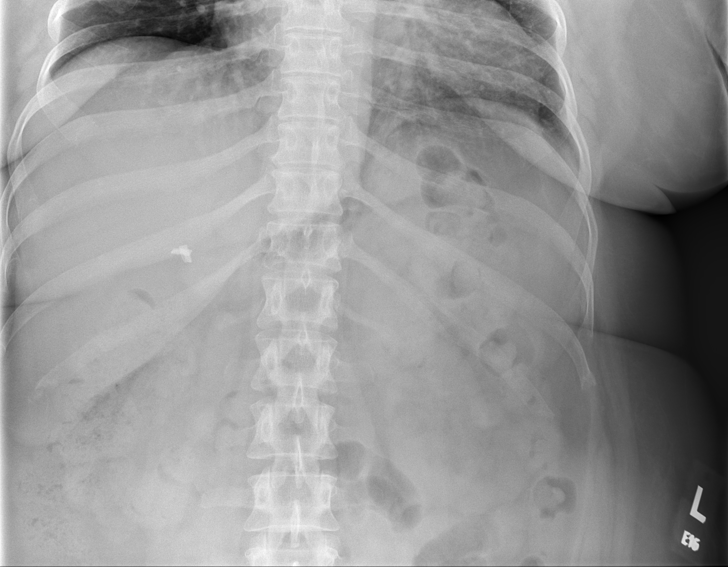

[Series 4: fluoro_barium 2fps_bw · 0.17mm/px · 1 of 1 slices shown (1 of 7)]
[im 1/1]
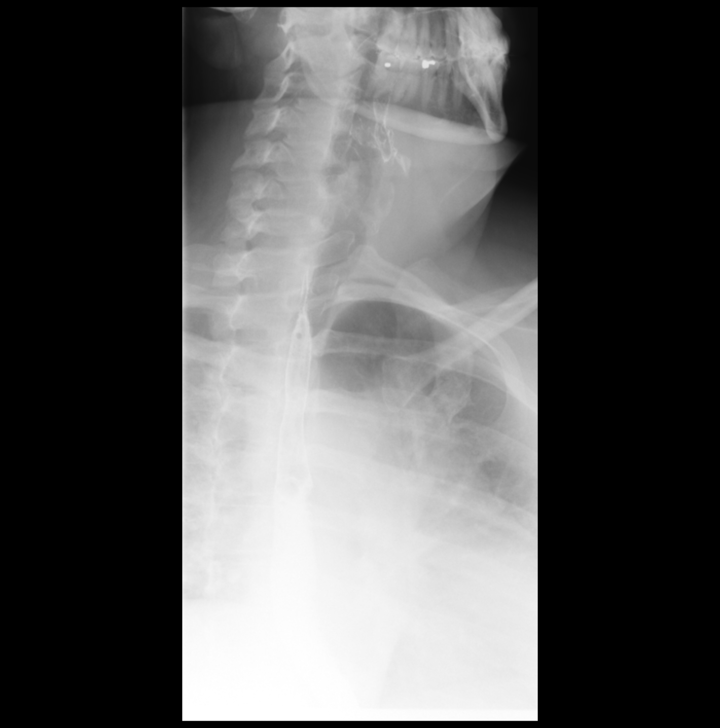

[Series 6: fluoro_barium 2fps_bw · 0.17mm/px · 1 of 1 slices shown (2 of 7)]
[im 1/1]
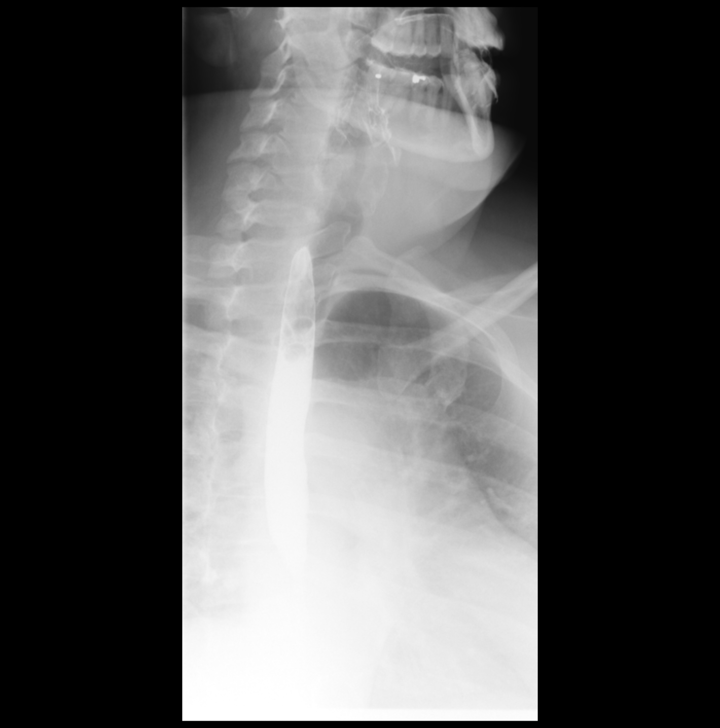

[Series 8: cp_standard · 0.52mm/px · 3 of 25 frames shown]
[frame 4/25]
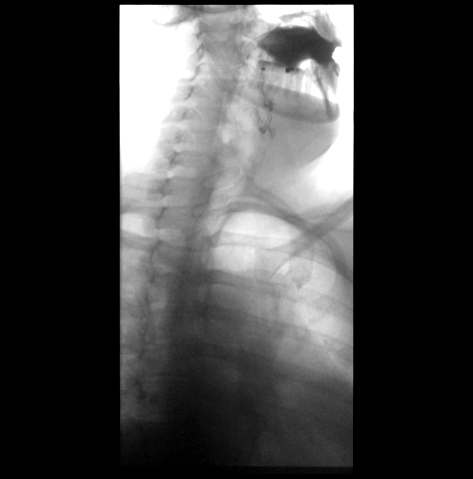
[frame 6/25]
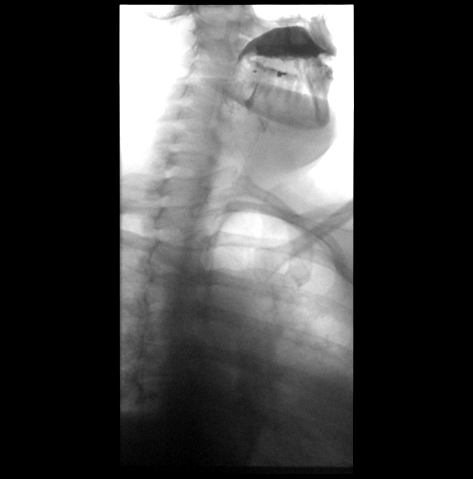
[frame 22/25]
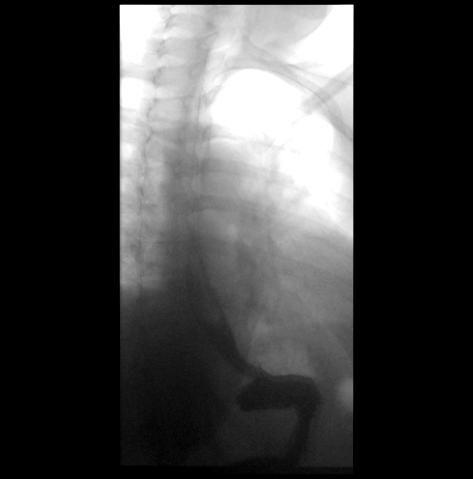

[Series 9: fluoro_barium 2fps_bw · 0.17mm/px · 1 of 1 slices shown (3 of 7)]
[im 1/1]
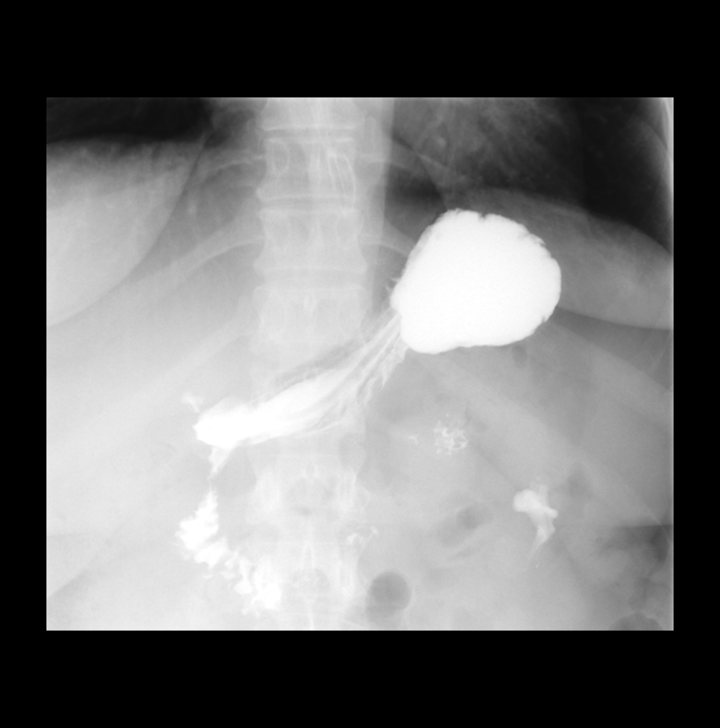

[Series 11: fluoro_barium 2fps_bw · 0.17mm/px · 1 of 1 slices shown (4 of 7)]
[im 1/1]
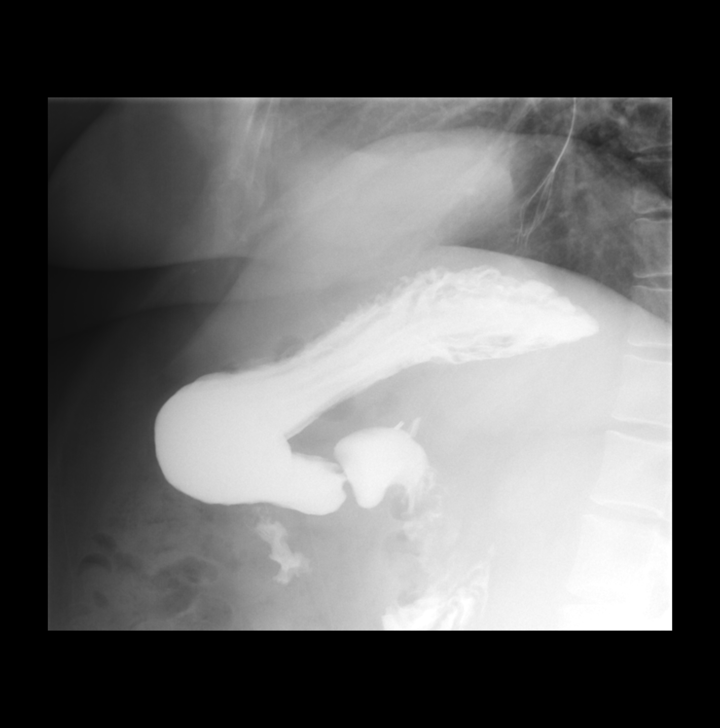

[Series 13: fluoro_barium 2fps_bw · 0.17mm/px · 1 of 1 slices shown (5 of 7)]
[im 1/1]
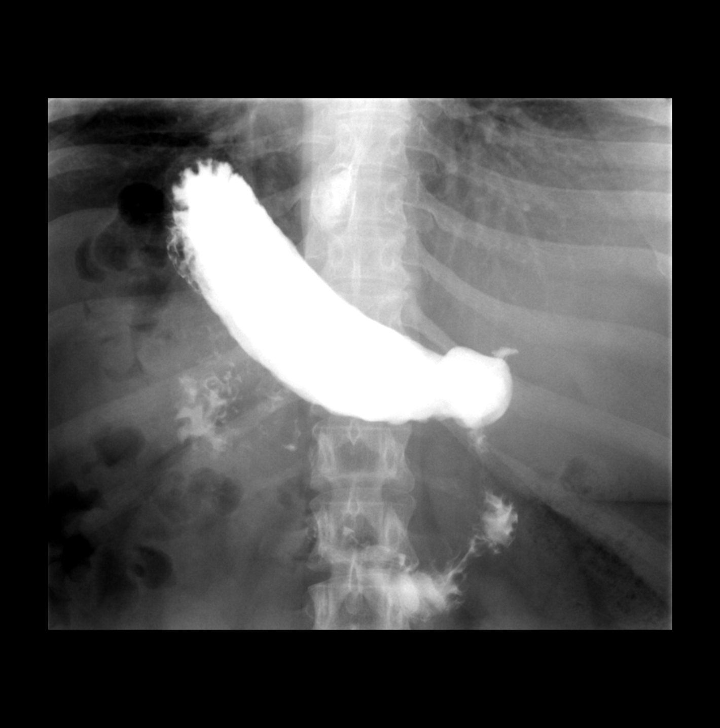

[Series 14: fluoro_barium 2fps_bw · 0.17mm/px · 1 of 1 slices shown (6 of 7)]
[im 1/1]
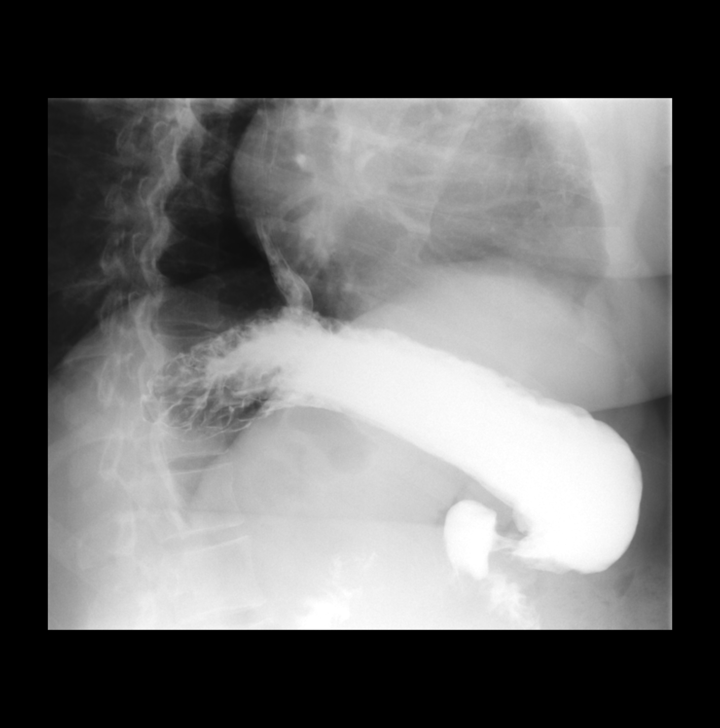

[Series 16: fluoro_barium 2fps_bw · 0.17mm/px · 1 of 1 slices shown (7 of 7)]
[im 1/1]
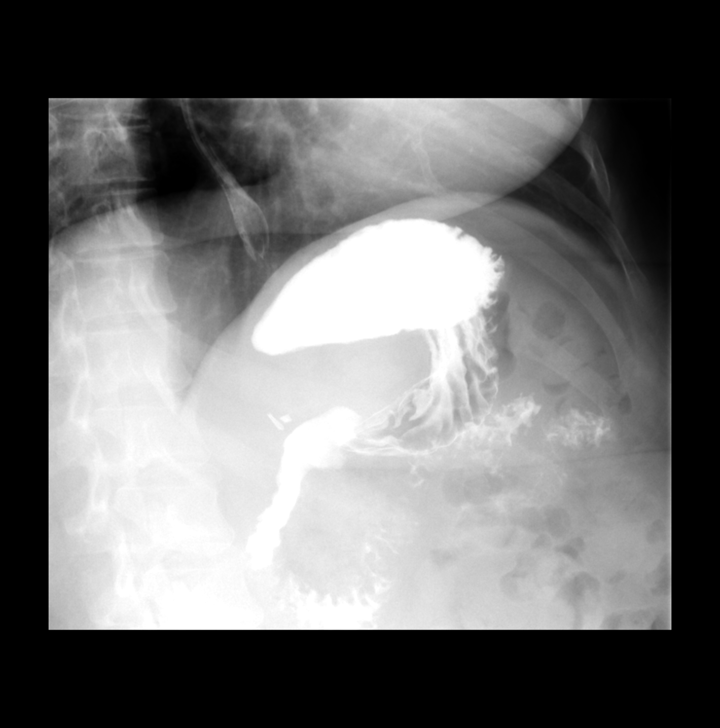

[12 of 19 positions shown; findings below may reference images not displayed]

FINDINGS: Preprocedural KUB demonstrates a nonobstructive bowel gas pattern
with no pneumoperitoneum. Surgical clips project over the right
upper quadrant of the abdomen, likely from prior cholecystectomy.

Images of the esophagus demonstrate normal esophageal anatomy, with
no evidence of esophageal mass, stricture or esophageal ring.
Esophageal motility was normal.

Within the limitations of this single contrast examination, the
appearance of the gastric mucosa is normal. No definite gastric mass
or ulcer. Duodenal bulb was normal in appearance.
IMPRESSION: 1. Normal single contrast upper GI, as above.

## 2021-11-21 IMAGING — DX DG CHEST 2V
2 series · 2 of 2 positions shown · non-contrast
Comparison: Chest x-ray 09/05/2010.

CLINICAL DATA: 49-year-old female under preoperative evaluation
prior to bariatric surgery.

EXAM:
CHEST - 2 VIEW

[chest pa]
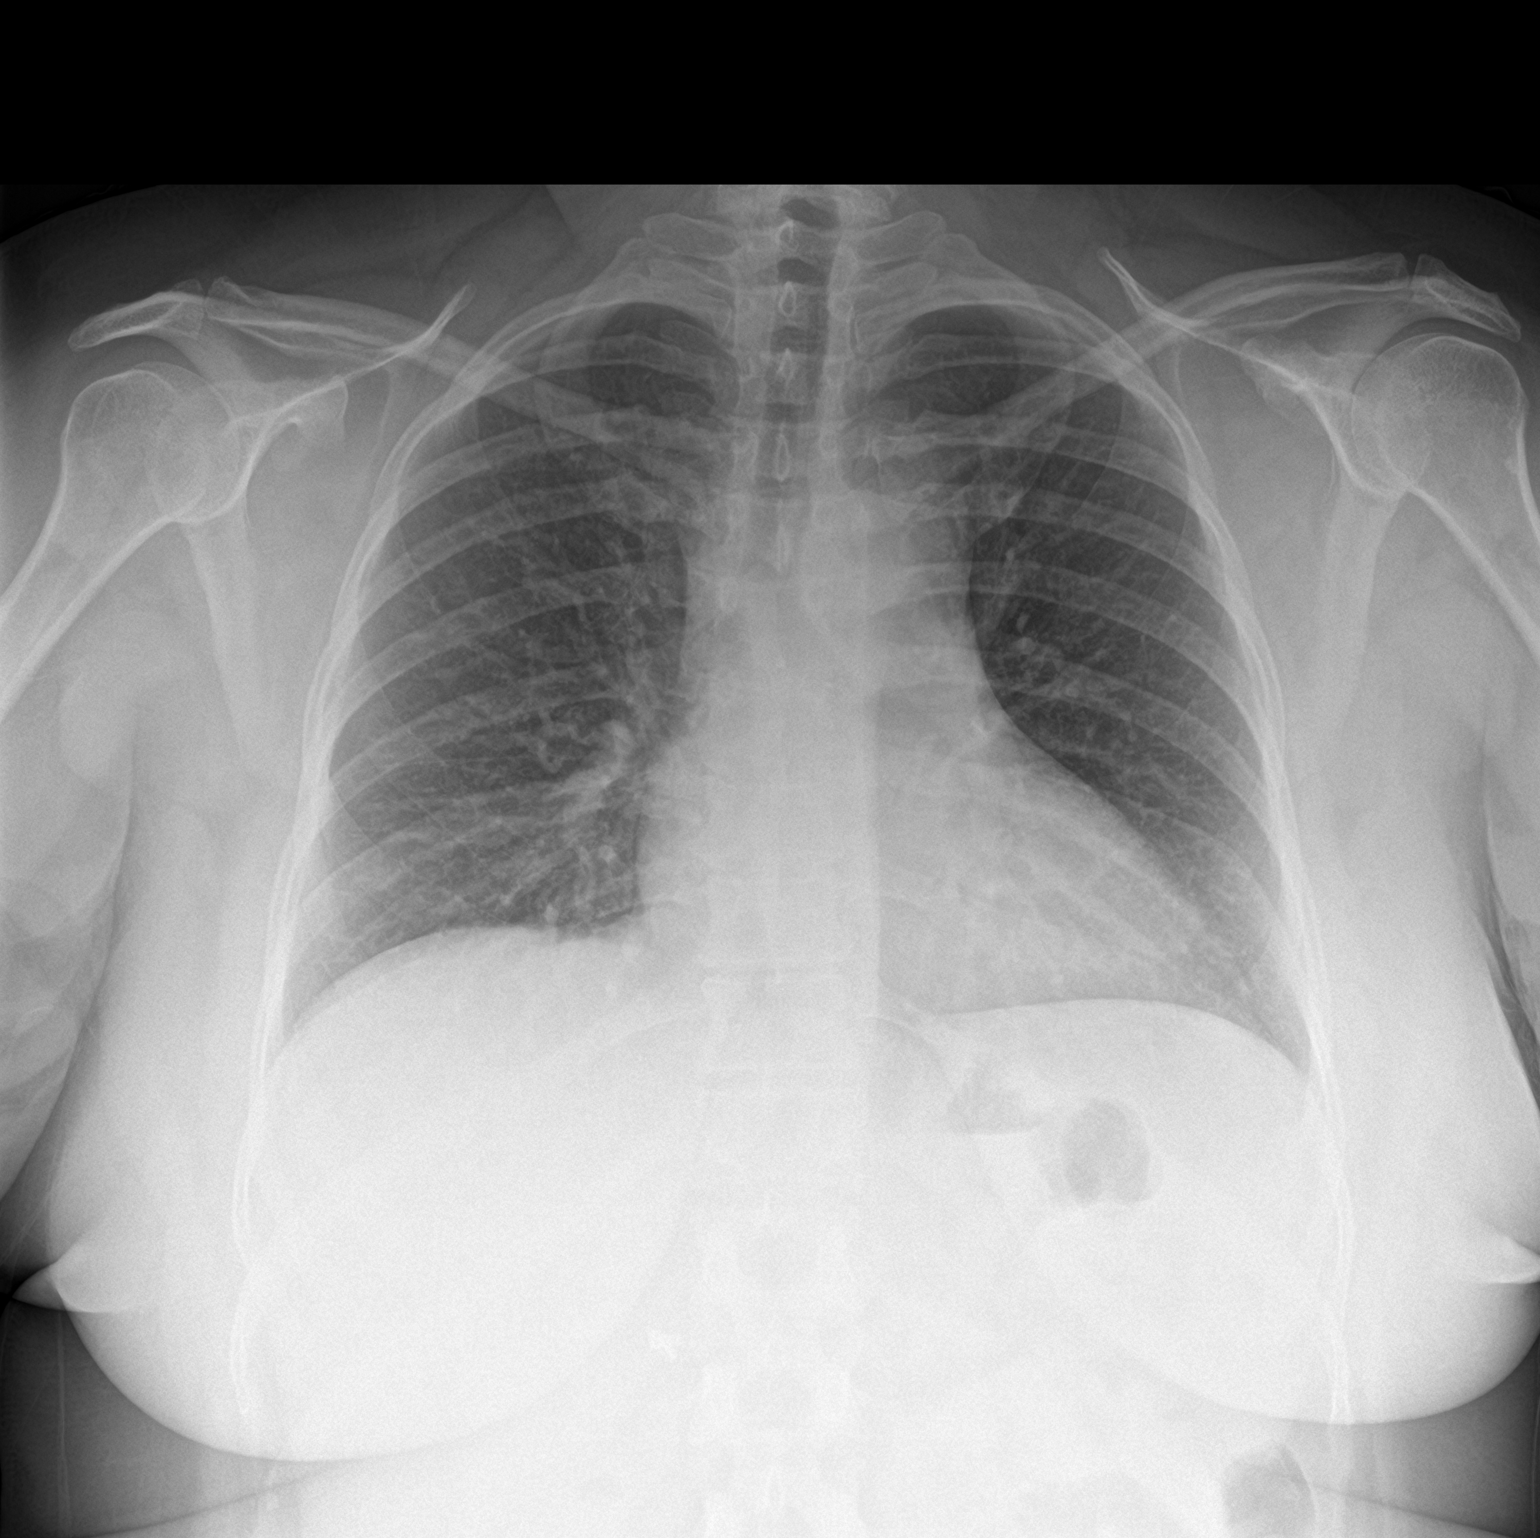

[chest lat]
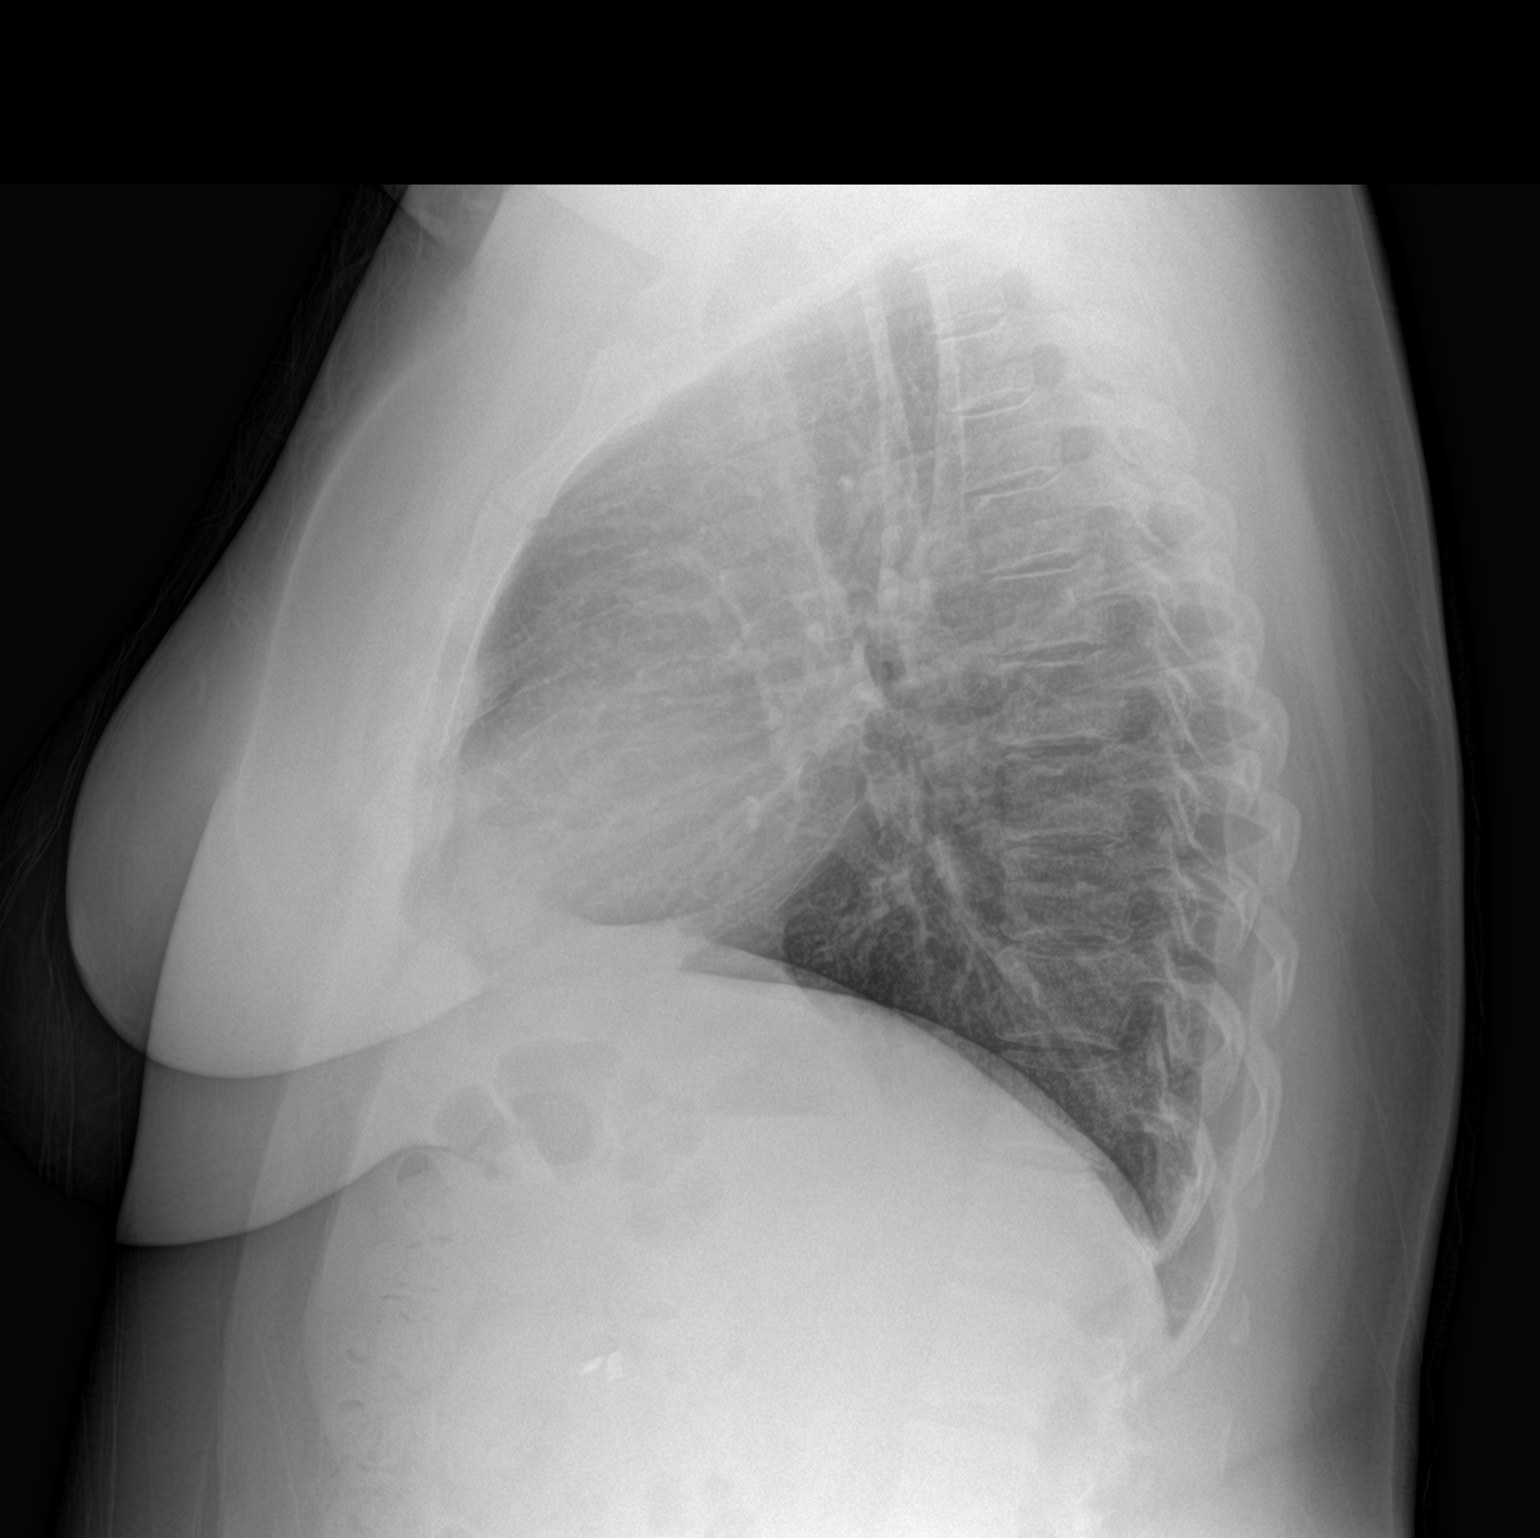

[2 of 2 positions shown; findings below may reference images not displayed]

FINDINGS: Lung volumes are normal. No consolidative airspace disease. No
pleural effusions. No pneumothorax. No pulmonary nodule or mass
noted. Pulmonary vasculature and the cardiomediastinal silhouette
are within normal limits. Surgical clips project over the right
upper quadrant of the abdomen, likely from prior cholecystectomy.
IMPRESSION: No radiographic evidence of acute cardiopulmonary disease.

## 2021-11-29 DIAGNOSIS — G4733 Obstructive sleep apnea (adult) (pediatric): Secondary | ICD-10-CM | POA: Diagnosis not present

## 2021-11-29 DIAGNOSIS — Z6831 Body mass index (BMI) 31.0-31.9, adult: Secondary | ICD-10-CM | POA: Diagnosis not present

## 2021-11-29 DIAGNOSIS — Z0000 Encounter for general adult medical examination without abnormal findings: Secondary | ICD-10-CM | POA: Diagnosis not present

## 2021-11-29 DIAGNOSIS — E669 Obesity, unspecified: Secondary | ICD-10-CM | POA: Diagnosis not present

## 2021-12-17 DIAGNOSIS — B079 Viral wart, unspecified: Secondary | ICD-10-CM | POA: Diagnosis not present

## 2021-12-17 DIAGNOSIS — L728 Other follicular cysts of the skin and subcutaneous tissue: Secondary | ICD-10-CM | POA: Diagnosis not present

## 2021-12-17 DIAGNOSIS — D2239 Melanocytic nevi of other parts of face: Secondary | ICD-10-CM | POA: Diagnosis not present

## 2021-12-17 DIAGNOSIS — D225 Melanocytic nevi of trunk: Secondary | ICD-10-CM | POA: Diagnosis not present

## 2021-12-17 DIAGNOSIS — L814 Other melanin hyperpigmentation: Secondary | ICD-10-CM | POA: Diagnosis not present

## 2021-12-31 ENCOUNTER — Other Ambulatory Visit: Payer: Self-pay | Admitting: *Deleted

## 2021-12-31 DIAGNOSIS — G4733 Obstructive sleep apnea (adult) (pediatric): Secondary | ICD-10-CM | POA: Diagnosis not present

## 2021-12-31 DIAGNOSIS — I8393 Asymptomatic varicose veins of bilateral lower extremities: Secondary | ICD-10-CM

## 2022-01-01 ENCOUNTER — Encounter: Payer: Self-pay | Admitting: Skilled Nursing Facility1

## 2022-01-01 ENCOUNTER — Encounter: Payer: BC Managed Care – PPO | Attending: General Surgery | Admitting: Skilled Nursing Facility1

## 2022-01-01 DIAGNOSIS — E669 Obesity, unspecified: Secondary | ICD-10-CM | POA: Insufficient documentation

## 2022-01-01 NOTE — Progress Notes (Signed)
Follow-up visit:  Post-Operative RYGB Surgery  Surgery date: 01/01/2021 Surgery type: RYGB Start weight at [facility]: 262 Weight today: 176.1 pounds   Body Composition Scale 01/16/2021 03/07/2021 06/06/2021 09/06/2021 01/01/2022  Current Body Weight 240.5 222 190.5 178.1 176.1  Total Body Fat % 45.6 43.2 38.9 36.7 36.5  Visceral Fat 16 14 11 10 10   Fat-Free Mass % 54.3 56.7 61 63.2 63.4   Total Body Water % 41.6 42.8 45 46.1 46.2  Muscle-Mass lbs 30.2 30.5 30.2 30 29.9  BMI 42.2 38.9 33.3 31.1 30.8  Body Fat Displacement              Torso  lbs 68 59.3 45.8 40.4 39.8         Left Leg  lbs 13.6 11.8 9.1 8 7.9         Right Leg  lbs 13.6 11.8 9.1 8 7.9         Left Arm  lbs 6.8 5.9 4.5 4 3.9         Right Arm   lbs 6.8 5.9 4.5 4 3.9   Clinical  Medical hx: prediabetes, sleep apnea  Medications: see list Labs: not updated in EMR Notable signs/symptoms: none identified  Any previous deficiencies? No  Pt arrives with a few questions which were answered to her satisfaction.    24-Hr Dietary Recall First Meal: sugar free coffee  Snack:  granola with protein yogurt or cottage cheese Second Meal: 1/2 sub or 1/2 sandwich or chicken salad  + crackers + hummus Snack:  popcorn or crackers and hummus or protein and granola or apple + peanut butter  Third Meal: salad and meat or mostly eaten out Snack: sugar free popcicle or protein hot chocolate  Beverages: water, decaf coffee, strawberry refresher (helps with hydration)   Fluid intake: 60 ounces   Medications: See List Supplementation: appropriate    Using straws: no Drinking while eating: no Having you been chewing well: yes Chewing/swallowing difficulties: no Changes in vision: no Changes to mood/headaches: no Hair loss/Cahnges to skin/Changes to nails: no Any difficulty focusing or concentrating: no Sweating: no Dizziness/Lightheaded: no Palpitations: no  Carbonated beverages: no N/V/D/C/GAS: no Abdominal Pain:  no Dumping syndrome: no  Recent physical activity:  playing on a tennis team, walking  Progress Towards Goal(s):  In Progress Teaching method utilized: Visual & Auditory  Demonstrated degree of understanding via: Teach Back  Readiness Level: Action Barriers to learning/adherence to lifestyle change: none identified  Goals: Encouraged patient to honor their body's internal hunger and fullness cues.  Throughout the day, check in mentally and rate hunger. Stop eating when satisfied not full regardless of how much food is left on the plate.  Get more if still hungry 20-30 minutes later.  The key is to honor satisfaction so throughout the meal, rate fullness factor and stop when comfortably satisfied not physically full. The key is to honor hunger and fullness without any feelings of guilt or shame.  Pay attention to what the internal cues are, rather than any external factors. This will enhance the confidence you have in listening to your own body and following those internal cues enabling you to increase how often you eat when you are hungry not out of appetite and stop when you are satisfied not full.  Encouraged pt to continue to eat balanced meals inclusive of non starchy vegetables 2 times a day 7 days a week Encouraged pt to choose lean protein sources: limiting beef, pork, sausage, hotdogs, and  lunch meat Encourage pt to choose healthy fats such as plant based limiting animal fats Encouraged pt to continue to drink a minium 64 fluid ounces with half being plain water to satisfy proper hydration   Teaching Method Utilized:  Visual Auditory Hands on  Demonstrated degree of understanding via:  Teach Back   Monitoring/Evaluation:  Dietary intake, exercise, and body weight.  Next appt: 6 months from now

## 2022-01-08 ENCOUNTER — Encounter: Payer: Self-pay | Admitting: Vascular Surgery

## 2022-01-08 ENCOUNTER — Ambulatory Visit (HOSPITAL_COMMUNITY)
Admission: RE | Admit: 2022-01-08 | Discharge: 2022-01-08 | Disposition: A | Discharge status: Home / Self Care | Payer: BC Managed Care – PPO | Source: Ambulatory Visit | Attending: Vascular Surgery | Admitting: Vascular Surgery

## 2022-01-08 ENCOUNTER — Ambulatory Visit: Payer: BC Managed Care – PPO | Admitting: Vascular Surgery

## 2022-01-08 DIAGNOSIS — I8393 Asymptomatic varicose veins of bilateral lower extremities: Secondary | ICD-10-CM | POA: Insufficient documentation

## 2022-01-08 DIAGNOSIS — I872 Venous insufficiency (chronic) (peripheral): Secondary | ICD-10-CM

## 2022-01-08 HISTORY — DX: Venous insufficiency (chronic) (peripheral): I87.2

## 2022-01-08 NOTE — Progress Notes (Signed)
Patient name: [name] MRN: [id number] DOB: 1970/08/25 Sex: female  REASON FOR CONSULT: Varicose veins  HPI: [name] is a 51 y.o. female, with history of Mnire's disease as well as obesity status post Roux-en-Y gastric bypass that presents for evaluation of varicose veins.  She has prominent varicosities in both lower extremities.  These are worse in the left leg.  She had bypass surgery last year with Dr. Andrey Campanile and lost about 86 pounds.  Most of the pain is in the large varicosity in her left thigh that causes discomfort throughout the day.  She does work as a Office manager.  Not wearing compression.  No history of DVT.  Past Medical History:  Diagnosis Date   Gestational diabetes    Meniere disease    PONV (postoperative nausea and vomiting)    Prediabetes    Sleep apnea     Past Surgical History:  Procedure Laterality Date   BREAST EXCISIONAL BIOPSY Left 2001   benign   CESAREAN SECTION N/A 2004 2010   x2   CHOLECYSTECTOMY     DILATION AND EVACUATION N/A 10/14/2014   Procedure: DILATATION AND EVACUATION;  Surgeon: Candice Camp, MD;  Location: [facility];  Service: Gynecology;  Laterality: N/A;   GASTRIC ROUX-EN-Y N/A 01/01/2021   Procedure: LAPAROSCOPIC ROUX-EN-Y GASTRIC BYPASS WITH UPPER ENDOSCOPY;  Surgeon: Gaynelle Adu, MD;  Location: [facility];  Service: General;  Laterality: N/A;   NASAL SEPTUM SURGERY Bilateral 2008   WISDOM TOOTH EXTRACTION      Family History  Problem Relation Age of Onset   BRCA 1/2 Maternal Grandmother     SOCIAL HISTORY: Social History   Socioeconomic History   Marital status: Married    Spouse name: Not on file   Number of children: Not on file   Years of education: Not on file   Highest education level: Not on file  Occupational History   Not on file  Tobacco Use   Smoking status: Never   Smokeless tobacco: Never  Vaping Use   Vaping Use: Never used  Substance and Sexual Activity   Alcohol use: Not Currently   Drug use:  Never   Sexual activity: Yes    Comment: husband had vasectomy  Other Topics Concern   Not on file  Social History Narrative   Not on file   Social Determinants of Health   Financial Resource Strain: Not on file  Food Insecurity: Not on file  Transportation Needs: Not on file  Physical Activity: Not on file  Stress: Not on file  Social Connections: Not on file  Intimate Partner Violence: Not on file    Allergies  Allergen Reactions   Shellfish Allergy Hives    Current Outpatient Medications  Medication Sig Dispense Refill   clonazePAM (KLONOPIN) 0.5 MG tablet Take 0.5 mg by mouth daily as needed for anxiety.     hydrochlorothiazide (MICROZIDE) 12.5 MG capsule Take 12.5 mg by mouth daily.     loratadine (CLARITIN) 10 MG tablet Take 10 mg by mouth daily.     meclizine (ANTIVERT) 25 MG tablet Take 25 mg by mouth 3 (three) times daily as needed for dizziness.     metroNIDAZOLE (METROCREAM) 0.75 % cream Apply 1 application topically 2 (two) times daily.     montelukast (SINGULAIR) 10 MG tablet Take 10 mg by mouth daily.     ondansetron (ZOFRAN-ODT) 4 MG disintegrating tablet Take 1 tablet (4 mg total) by mouth every 6 (six) hours  as needed for nausea or vomiting. 20 tablet 0   pantoprazole (PROTONIX) 40 MG tablet Take 40 mg by mouth daily. (Patient not taking: Reported on 01/08/2022)     traMADol (ULTRAM) 50 MG tablet Take 1 tablet (50 mg total) by mouth every 6 (six) hours as needed (pain). (Patient not taking: Reported on 01/08/2022) 10 tablet 0   No current facility-administered medications for this visit.    REVIEW OF SYSTEMS:  [X]  denotes positive finding, [ ]  denotes negative finding Cardiac  Comments:  Chest pain or chest pressure:    Shortness of breath upon exertion:    Short of breath when lying flat:    Irregular heart rhythm:        Vascular    Pain in calf, thigh, or hip brought on by ambulation:    Pain in feet at night that wakes you up from your sleep:      Blood clot in your veins:    Leg swelling:         Pulmonary    Oxygen at home:    Productive cough:     Wheezing:         Neurologic    Sudden weakness in arms or legs:     Sudden numbness in arms or legs:     Sudden onset of difficulty speaking or slurred speech:    Temporary loss of vision in one eye:     Problems with dizziness:         Gastrointestinal    Blood in stool:     Vomited blood:         Genitourinary    Burning when urinating:     Blood in urine:        Psychiatric    Major depression:         Hematologic    Bleeding problems:    Problems with blood clotting too easily:        Skin    Rashes or ulcers:        Constitutional    Fever or chills:      PHYSICAL EXAM: Vitals:   01/08/22 1140  BP: 109/71  Pulse: 65  Resp: 14  Temp: 98 F (36.7 C)  TempSrc: Temporal  SpO2: 98%  Weight: 177 lb (80.3 kg)  Height: 5\' 3"  (1.6 m)    GENERAL: The patient is a well-nourished female, in no acute distress. The vital signs are documented above. CARDIAC: There is a regular rate and rhythm.  VASCULAR:  Palpable femoral pulses bilaterally Palpable DP pulses bilaterally Prominent varicose veins anterior bilateral thighs PULMONARY: No respiratory distress ABDOMEN: Soft and non-tender. MUSCULOSKELETAL: There are no major deformities or cyanosis. NEUROLOGIC: No focal weakness or paresthesias are detected. SKIN: There are no ulcers or rashes noted. PSYCHIATRIC: The patient has a normal affect.  DATA:   Lower Venous Reflux Study   Patient Name:  [name]  Date of Exam:   01/08/2022  Medical Rec #: [id number]       Accession #:    [id number]  Date of Birth: Aug 09, 1970       Patient Gender: F  Patient Age:   72 years  Exam Location:  [facility]  Procedure:      VAS [place] LOWER EXTREMITY VENOUS REFLUX  Referring Phys: Sherald Hess    ---------------------------------------------------------------------------  -----      Indications: Venous insufficiency.     Performing Technologist: Thereasa Parkin RVT  Examination Guidelines: A complete evaluation includes B-mode imaging,  spectral  Doppler, color Doppler, and power Doppler as needed of all accessible  portions  of each vessel. Bilateral testing is considered an integral part of a  complete  examination. Limited examinations for reoccurring indications may be  performed  as noted. The reflux portion of the exam is performed with the patient in  reverse Trendelenburg.  Significant venous reflux is defined as >500 ms in the superficial venous  system, and >1 second in the deep venous system.      +------------------+---------+------+-----------+------------+--------+  LEFT              Reflux NoRefluxReflux TimeDiameter cmsComments                              Yes                                   +------------------+---------+------+-----------+------------+--------+  CFV                         yes   >1 second                       +------------------+---------+------+-----------+------------+--------+  FV prox           no                                              +------------------+---------+------+-----------+------------+--------+  FV mid            no                                              +------------------+---------+------+-----------+------------+--------+  FV dist           no                                              +------------------+---------+------+-----------+------------+--------+  Popliteal         no                                              +------------------+---------+------+-----------+------------+--------+  GSV at SFJ                  yes    >500 ms     0.971              +------------------+---------+------+-----------+------------+--------+  GSV prox thigh    no                           0.397               +------------------+---------+------+-----------+------------+--------+  GSV mid thigh     no  0.308              +------------------+---------+------+-----------+------------+--------+  GSV dist thigh    no                           0.371              +------------------+---------+------+-----------+------------+--------+  GSV at knee       no                           0.329              +------------------+---------+------+-----------+------------+--------+  GSV prox calf     no                           0.207              +------------------+---------+------+-----------+------------+--------+  SSV Pop Fossa     no                           0.375              +------------------+---------+------+-----------+------------+--------+  SSV prox calf     no                           0.339              +------------------+---------+------+-----------+------------+--------+  SSV mid calf      no                           0.274              +------------------+---------+------+-----------+------------+--------+  AASV prox                   yes    >500 ms     0.589              +------------------+---------+------+-----------+------------+--------+  AASV mid                    yes    >500 ms     0.457              +------------------+---------+------+-----------+------------+--------+  AASV mid to distal                                      tortuous  +------------------+---------+------+-----------+------------+--------+      Summary:  Left:  Thrombus  - No evidence of deep vein thrombosis seen in the left lower extremity,  from the common femoral through the popliteal veins.  - No evidence of superficial venous thrombosis in the left lower  extremity.     Deep veins  - Deep vein reflux in the CFV.   Superficial veins  - Superficial vein reflux in the SFJ and the AASV.    Assessment/Plan:  51 year old female with Mnire's disease as well as obesity status post Roux-en-Y gastric bypass that presents with chronic venous insufficiency of the bilateral lower extremities.  This is worse in the left leg where she has a large anterior accessory saphenous vein causing a lot of discomfort.  I discussed the etiology of venous disease.  I recommended conservative measures with leg elevation, compression  and exercise and she is already doing an excellent job with weight loss.  I will have her see one of my partners in 3 months to see if there is any option for intervention on her anterior accessory saphenous vein that is where she has most of her discomfort.   Marty Heck, MD [facility] Office: [phone]

## 2022-01-10 DIAGNOSIS — G4733 Obstructive sleep apnea (adult) (pediatric): Secondary | ICD-10-CM | POA: Diagnosis not present

## 2022-01-30 DIAGNOSIS — E6609 Other obesity due to excess calories: Secondary | ICD-10-CM | POA: Diagnosis not present

## 2022-01-30 DIAGNOSIS — Z87898 Personal history of other specified conditions: Secondary | ICD-10-CM | POA: Diagnosis not present

## 2022-01-30 DIAGNOSIS — Z9884 Bariatric surgery status: Secondary | ICD-10-CM | POA: Diagnosis not present

## 2022-01-30 DIAGNOSIS — G4733 Obstructive sleep apnea (adult) (pediatric): Secondary | ICD-10-CM | POA: Diagnosis not present

## 2022-02-10 DIAGNOSIS — G4733 Obstructive sleep apnea (adult) (pediatric): Secondary | ICD-10-CM | POA: Diagnosis not present

## 2022-02-13 DIAGNOSIS — R7303 Prediabetes: Secondary | ICD-10-CM | POA: Diagnosis not present

## 2022-02-13 DIAGNOSIS — Z9884 Bariatric surgery status: Secondary | ICD-10-CM | POA: Diagnosis not present

## 2022-02-18 DIAGNOSIS — F4322 Adjustment disorder with anxiety: Secondary | ICD-10-CM | POA: Diagnosis not present

## 2022-03-01 DIAGNOSIS — Z6831 Body mass index (BMI) 31.0-31.9, adult: Secondary | ICD-10-CM | POA: Diagnosis not present

## 2022-03-01 DIAGNOSIS — E6609 Other obesity due to excess calories: Secondary | ICD-10-CM | POA: Diagnosis not present

## 2022-03-05 DIAGNOSIS — G4733 Obstructive sleep apnea (adult) (pediatric): Secondary | ICD-10-CM | POA: Diagnosis not present

## 2022-03-05 DIAGNOSIS — F4322 Adjustment disorder with anxiety: Secondary | ICD-10-CM | POA: Diagnosis not present

## 2022-03-12 DIAGNOSIS — G4733 Obstructive sleep apnea (adult) (pediatric): Secondary | ICD-10-CM | POA: Diagnosis not present

## 2022-03-25 DIAGNOSIS — G4733 Obstructive sleep apnea (adult) (pediatric): Secondary | ICD-10-CM | POA: Diagnosis not present

## 2022-03-28 DIAGNOSIS — F4322 Adjustment disorder with anxiety: Secondary | ICD-10-CM | POA: Diagnosis not present

## 2022-03-29 DIAGNOSIS — H8101 Meniere's disease, right ear: Secondary | ICD-10-CM | POA: Diagnosis not present

## 2022-03-29 DIAGNOSIS — R490 Dysphonia: Secondary | ICD-10-CM | POA: Diagnosis not present

## 2022-03-29 DIAGNOSIS — J387 Other diseases of larynx: Secondary | ICD-10-CM | POA: Diagnosis not present

## 2022-04-05 DIAGNOSIS — Z1231 Encounter for screening mammogram for malignant neoplasm of breast: Secondary | ICD-10-CM | POA: Diagnosis not present

## 2022-04-09 DIAGNOSIS — J111 Influenza due to unidentified influenza virus with other respiratory manifestations: Secondary | ICD-10-CM | POA: Diagnosis not present

## 2022-04-09 DIAGNOSIS — R509 Fever, unspecified: Secondary | ICD-10-CM | POA: Diagnosis not present

## 2022-04-09 DIAGNOSIS — R0981 Nasal congestion: Secondary | ICD-10-CM | POA: Diagnosis not present

## 2022-04-09 DIAGNOSIS — R051 Acute cough: Secondary | ICD-10-CM | POA: Diagnosis not present

## 2022-04-16 DIAGNOSIS — R0981 Nasal congestion: Secondary | ICD-10-CM | POA: Diagnosis not present

## 2022-04-16 DIAGNOSIS — R051 Acute cough: Secondary | ICD-10-CM | POA: Diagnosis not present

## 2022-04-16 DIAGNOSIS — R509 Fever, unspecified: Secondary | ICD-10-CM | POA: Diagnosis not present

## 2022-04-16 DIAGNOSIS — R519 Headache, unspecified: Secondary | ICD-10-CM | POA: Diagnosis not present

## 2022-04-17 ENCOUNTER — Ambulatory Visit: Payer: BC Managed Care – PPO | Admitting: Vascular Surgery

## 2022-05-14 DIAGNOSIS — G4733 Obstructive sleep apnea (adult) (pediatric): Secondary | ICD-10-CM | POA: Diagnosis not present

## 2022-05-22 ENCOUNTER — Ambulatory Visit: Payer: BC Managed Care – PPO | Admitting: Vascular Surgery

## 2022-05-22 ENCOUNTER — Encounter: Payer: Self-pay | Admitting: Vascular Surgery

## 2022-05-22 VITALS — BP 105/72 | HR 78 | Temp 97.6°F | Resp 16 | Ht 63.0 in | Wt 183.0 lb

## 2022-05-22 DIAGNOSIS — I8393 Asymptomatic varicose veins of bilateral lower extremities: Secondary | ICD-10-CM

## 2022-05-22 DIAGNOSIS — I872 Venous insufficiency (chronic) (peripheral): Secondary | ICD-10-CM | POA: Diagnosis not present

## 2022-05-22 NOTE — Progress Notes (Signed)
REASON FOR VISIT:   Follow-up of painful varicose veins.  MEDICAL ISSUES:   CHRONIC VENOUS INSUFFICIENCY: This patient has deep venous reflux and superficial venous reflux in the left leg.  She has some small varicose veins in the left thigh and leg.  There is only a short segment of reflux in the left anterior accessory saphenous vein which would be amenable to laser angioplasty.  Given that the veins currently are fairly small and the symptoms are not significant I would favor conservative measures for now.  We discussed the importance of daily leg elevation and the proper positioning for this.  I have encouraged her to continue to wear her compression stockings as she sits for long hours at work.  We discussed importance of exercise specifically walking and water aerobics.  I encouraged her to avoid prolonged sitting and standing.  She has lost significant weight and this is certainly helpful in lowering venous pressure.  She will call if her symptoms progress.  If her varicosities increase in size and she becomes more symptomatic she may be a candidate for laser ablation of the left anterior accessory saphenous vein and stab phlebectomies.  HPI:   [name] is a pleasant 52 y.o. female who was seen by Dr. Carlis Abbott on 01/08/2022 with painful varicose veins of both lower extremities.  These were more significant on the left side.  Patient is undergone previous gastric bypass surgery and had lost 86 pounds at that point.  She had a large varicosity in her left thigh that was causing significant comfort throughout the day.  Her duplex scan showed some reflux in the deep system and also reflux in the left anterior accessory saphenous vein.  Dr. Carlis Abbott recommended leg elevation, compression therapy (thigh-high stockings with a gradient of 20 to 30 mmHg), and exercise.  She comes in for 39-monthfollow-up visit.  The patient today notes some aching pain in her legs which she is gradually relieved with  elevation.  She sits at work and by the end of the day she is having symptoms of achiness and heaviness.  Her symptoms are more significant on the left side.  She has not had any previous venous procedures.  She has no history of DVT.  She has been wearing her compression stockings.  She does elevate her legs which helps to some.  Past Medical History:  Diagnosis Date   Gestational diabetes    Meniere disease    PONV (postoperative nausea and vomiting)    Prediabetes    Sleep apnea     Family History  Problem Relation Age of Onset   BRCA 1/2 Maternal Grandmother     SOCIAL HISTORY: Social History   Tobacco Use   Smoking status: Never   Smokeless tobacco: Never  Substance Use Topics   Alcohol use: Not Currently    Allergies  Allergen Reactions   Shellfish Allergy Hives    Current Outpatient Medications  Medication Sig Dispense Refill   hydrochlorothiazide (MICROZIDE) 12.5 MG capsule Take 12.5 mg by mouth daily.     loratadine (CLARITIN) 10 MG tablet Take 10 mg by mouth daily.     metroNIDAZOLE (METROCREAM) 0.75 % cream Apply 1 application topically 2 (two) times daily.     montelukast (SINGULAIR) 10 MG tablet Take 10 mg by mouth daily.     clonazePAM (KLONOPIN) 0.5 MG tablet Take 0.5 mg by mouth daily as needed for anxiety. (Patient not taking: Reported on 05/22/2022)  meclizine (ANTIVERT) 25 MG tablet Take 25 mg by mouth 3 (three) times daily as needed for dizziness. (Patient not taking: Reported on 05/22/2022)     ondansetron (ZOFRAN-ODT) 4 MG disintegrating tablet Take 1 tablet (4 mg total) by mouth every 6 (six) hours as needed for nausea or vomiting. (Patient not taking: Reported on 05/22/2022) 20 tablet 0   pantoprazole (PROTONIX) 40 MG tablet Take 40 mg by mouth daily. (Patient not taking: Reported on 05/22/2022)     traMADol (ULTRAM) 50 MG tablet Take 1 tablet (50 mg total) by mouth every 6 (six) hours as needed (pain). (Patient not taking: Reported on 05/22/2022) 10  tablet 0   No current facility-administered medications for this visit.    REVIEW OF SYSTEMS:  '[X]'$  denotes positive finding, '[ ]'$  denotes negative finding Cardiac  Comments:  Chest pain or chest pressure:    Shortness of breath upon exertion:    Short of breath when lying flat:    Irregular heart rhythm:        Vascular    Pain in calf, thigh, or hip brought on by ambulation:    Pain in feet at night that wakes you up from your sleep:     Blood clot in your veins:    Leg swelling:         Pulmonary    Oxygen at home:    Productive cough:     Wheezing:         Neurologic    Sudden weakness in arms or legs:     Sudden numbness in arms or legs:     Sudden onset of difficulty speaking or slurred speech:    Temporary loss of vision in one eye:     Problems with dizziness:         Gastrointestinal    Blood in stool:     Vomited blood:         Genitourinary    Burning when urinating:     Blood in urine:        Psychiatric    Major depression:         Hematologic    Bleeding problems:    Problems with blood clotting too easily:        Skin    Rashes or ulcers:        Constitutional    Fever or chills:     PHYSICAL EXAM:   Vitals:   05/22/22 1450  BP: 105/72  Pulse: 78  Resp: 16  Temp: 97.6 F (36.4 C)  TempSrc: Temporal  SpO2: 100%  Weight: 183 lb (83 kg)  Height: '5\' 3"'$  (1.6 m)    GENERAL: The patient is a well-nourished female, in no acute distress. The vital signs are documented above. CARDIAC: There is a regular rate and rhythm.  VASCULAR: I do not detect carotid bruits. She has palpable pedal pulses. She has some small varicose veins along her anterior left leg and left thigh as documented in the photographs below.       I did look at her left anterior accessory saphenous vein myself.  She does have reflux in the proximal thigh.  The the segment that we could treat would be fairly short.  It would probably be about 15 cm segment. PULMONARY:  There is good air exchange bilaterally without wheezing or rales. ABDOMEN: Soft and non-tender with normal pitched bowel sounds.  MUSCULOSKELETAL: There are no major deformities or cyanosis. NEUROLOGIC: No focal weakness or paresthesias  are detected. SKIN: There are no ulcers or rashes noted. PSYCHIATRIC: The patient has a normal affect.  DATA:    VENOUS DUPLEX: I have reviewed her venous duplex scan that was done on 01/08/2022.  This was of the left lower extremity only.  There was no evidence of DVT.  There was deep venous reflux in the common femoral vein.  There was superficial venous reflux in the left anterior accessory saphenous vein.  Diameters of the vein ranged from 5 to 6 mm.  Results of this test are summarized in the diagram below.    Deitra Mayo [facility] [phone]

## 2022-06-12 DIAGNOSIS — B079 Viral wart, unspecified: Secondary | ICD-10-CM | POA: Diagnosis not present

## 2022-06-12 DIAGNOSIS — L710 Perioral dermatitis: Secondary | ICD-10-CM | POA: Diagnosis not present

## 2022-06-12 DIAGNOSIS — L821 Other seborrheic keratosis: Secondary | ICD-10-CM | POA: Diagnosis not present

## 2022-06-15 DIAGNOSIS — H9203 Otalgia, bilateral: Secondary | ICD-10-CM | POA: Diagnosis not present

## 2022-06-15 DIAGNOSIS — J069 Acute upper respiratory infection, unspecified: Secondary | ICD-10-CM | POA: Diagnosis not present

## 2022-06-15 DIAGNOSIS — R070 Pain in throat: Secondary | ICD-10-CM | POA: Diagnosis not present

## 2022-07-02 ENCOUNTER — Encounter: Payer: Self-pay | Admitting: Skilled Nursing Facility1

## 2022-07-02 ENCOUNTER — Encounter: Payer: BC Managed Care – PPO | Attending: Family Medicine | Admitting: Skilled Nursing Facility1

## 2022-07-02 VITALS — Ht 63.0 in | Wt 179.8 lb

## 2022-07-02 DIAGNOSIS — E669 Obesity, unspecified: Secondary | ICD-10-CM | POA: Diagnosis not present

## 2022-07-02 NOTE — Progress Notes (Signed)
Follow-up visit:  Post-Operative RYGB Surgery  Surgery date: 01/01/2021 Surgery type: RYGB Start weight at [facility]: 262 Weight today: 179.8 pounds   Body Composition Scale 03/07/2021 06/06/2021 09/06/2021 01/01/2022 07/02/2022  Current Body Weight 222 190.5 178.1 176.1 179.8  Total Body Fat % 43.2 38.9 36.7 36.5 37.2  Visceral Fat 14 11 10 10 10   Fat-Free Mass % 56.7 61 63.2 63.4 62.7   Total Body Water % 42.8 45 46.1 46.2 45.8  Muscle-Mass lbs 30.5 30.2 30 29.9 30  BMI 38.9 33.3 31.1 30.8 31.5  Body Fat Displacement              Torso  lbs 59.3 45.8 40.4 39.8 41.4         Left Leg  lbs 11.8 9.1 8 7.9 8.2         Right Leg  lbs 11.8 9.1 8 7.9 8.2         Left Arm  lbs 5.9 4.5 4 3.9 4.1         Right Arm   lbs 5.9 4.5 4 3.9 4.1   Clinical  Medical hx: prediabetes, sleep apnea  Medications: see list Labs: not updated in EMR Notable signs/symptoms: none identified  Any previous deficiencies? No   Pt states she worries about falling back in to old habits.  Pt states every couple weeks she gets diarrhea for 3 days at a time.  Pt sates she started with night sweats in the last 3 months.  Pt states she does feel shaky around once every 2 weeks al;so stating when she starts to feel off she will grab a pack of crackers before feeling shaky.   24-Hr Dietary Recall First Meal: sugar free coffee  Snack:  granola with protein yogurt or cottage cheese Second Meal: 1/2 sub or 1/2 sandwich or chicken salad  + crackers + hummus Snack:  popcorn or crackers and hummus or protein and granola or apple + peanut butter  Third Meal: salad and meat or mostly eaten out Snack: sugar free popcicle or protein hot chocolate  Beverages: water, decaf coffee, strawberry refresher (helps with hydration)   Fluid intake: 60 ounces   Medications: See List Supplementation: appropriate    Using straws: no Drinking while eating: no Having you been chewing well: yes Chewing/swallowing difficulties:  no Changes in vision: no Changes to mood/headaches: no Hair loss/Cahnges to skin/Changes to nails: no Any difficulty focusing or concentrating: no Sweating: no Dizziness/Lightheaded: no Palpitations: no  Carbonated beverages: no N/V/D/C/GAS: no Abdominal Pain: no Dumping syndrome: no  Recent physical activity:  playing on a tennis team, walking  Progress Towards Goal(s):  In Progress Teaching method utilized: Visual & Auditory  Demonstrated degree of understanding via: Teach Back  Readiness Level: Action Barriers to learning/adherence to lifestyle change: none identified  Goals: Encouraged patient to honor their body's internal hunger and fullness cues.  Throughout the day, check in mentally and rate hunger. Stop eating when satisfied not full regardless of how much food is left on the plate.  Get more if still hungry 20-30 minutes later.  The key is to honor satisfaction so throughout the meal, rate fullness factor and stop when comfortably satisfied not physically full. The key is to honor hunger and fullness without any feelings of guilt or shame.  Pay attention to what the internal cues are, rather than any external factors. This will enhance the confidence you have in listening to your own body and following those internal cues enabling you  to increase how often you eat when you are hungry not out of appetite and stop when you are satisfied not full.  Encouraged pt to continue to eat balanced meals inclusive of non starchy vegetables 2 times a day 7 days a week Encouraged pt to choose lean protein sources: limiting beef, pork, sausage, hotdogs, and lunch meat Encourage pt to choose healthy fats such as plant based limiting animal fats Encouraged pt to continue to drink a minium 64 fluid ounces with half being plain water to satisfy proper hydration   Goals: Family therpay to reduce stress and anxiety with teenage daughter; speak with daughters therapist about this Check your blood  sugars when feeling off; anything below 70 correct with fat free cows milk Get labs drawn: thiamin, CBC, CMP Eat balanced complex carbs with protein every 2-3 hours Avoid fatty rish foods as they may be the culprit for the diarrhea When you have diarrhea write everything you ate and drank that day and the day before  Teaching Method Utilized:  Visual Auditory Hands on  Demonstrated degree of understanding via:  Teach Back   Monitoring/Evaluation:  Dietary intake, exercise, and body weight.  Next appt: 3-4 weeks

## 2022-07-05 DIAGNOSIS — Z01419 Encounter for gynecological examination (general) (routine) without abnormal findings: Secondary | ICD-10-CM | POA: Diagnosis not present

## 2022-07-05 DIAGNOSIS — Z6832 Body mass index (BMI) 32.0-32.9, adult: Secondary | ICD-10-CM | POA: Diagnosis not present

## 2022-07-05 DIAGNOSIS — R319 Hematuria, unspecified: Secondary | ICD-10-CM | POA: Diagnosis not present

## 2022-08-01 DIAGNOSIS — F320 Major depressive disorder, single episode, mild: Secondary | ICD-10-CM | POA: Diagnosis not present

## 2022-08-01 DIAGNOSIS — F411 Generalized anxiety disorder: Secondary | ICD-10-CM | POA: Diagnosis not present

## 2022-08-01 DIAGNOSIS — K219 Gastro-esophageal reflux disease without esophagitis: Secondary | ICD-10-CM | POA: Diagnosis not present

## 2022-08-01 DIAGNOSIS — Z9884 Bariatric surgery status: Secondary | ICD-10-CM | POA: Diagnosis not present

## 2022-08-01 DIAGNOSIS — Z6833 Body mass index (BMI) 33.0-33.9, adult: Secondary | ICD-10-CM | POA: Diagnosis not present

## 2022-08-16 DIAGNOSIS — R350 Frequency of micturition: Secondary | ICD-10-CM | POA: Diagnosis not present

## 2022-08-27 ENCOUNTER — Ambulatory Visit: Payer: BC Managed Care – PPO | Admitting: Skilled Nursing Facility1

## 2022-10-01 DIAGNOSIS — H9312 Tinnitus, left ear: Secondary | ICD-10-CM | POA: Diagnosis not present

## 2022-10-01 DIAGNOSIS — J3089 Other allergic rhinitis: Secondary | ICD-10-CM | POA: Diagnosis not present

## 2022-10-01 DIAGNOSIS — H8103 Meniere's disease, bilateral: Secondary | ICD-10-CM | POA: Diagnosis not present

## 2022-10-02 ENCOUNTER — Ambulatory Visit: Payer: BC Managed Care – PPO | Admitting: Skilled Nursing Facility1

## 2022-10-29 DIAGNOSIS — M722 Plantar fascial fibromatosis: Secondary | ICD-10-CM | POA: Diagnosis not present

## 2022-12-18 DIAGNOSIS — L814 Other melanin hyperpigmentation: Secondary | ICD-10-CM | POA: Diagnosis not present

## 2022-12-18 DIAGNOSIS — L719 Rosacea, unspecified: Secondary | ICD-10-CM | POA: Diagnosis not present

## 2022-12-18 DIAGNOSIS — B079 Viral wart, unspecified: Secondary | ICD-10-CM | POA: Diagnosis not present

## 2022-12-18 DIAGNOSIS — D225 Melanocytic nevi of trunk: Secondary | ICD-10-CM | POA: Diagnosis not present

## 2022-12-18 DIAGNOSIS — D2239 Melanocytic nevi of other parts of face: Secondary | ICD-10-CM | POA: Diagnosis not present

## 2023-03-27 DIAGNOSIS — M545 Low back pain, unspecified: Secondary | ICD-10-CM | POA: Diagnosis not present

## 2023-03-28 DIAGNOSIS — G4733 Obstructive sleep apnea (adult) (pediatric): Secondary | ICD-10-CM | POA: Diagnosis not present

## 2023-04-09 DIAGNOSIS — Z1231 Encounter for screening mammogram for malignant neoplasm of breast: Secondary | ICD-10-CM | POA: Diagnosis not present

## 2023-04-09 DIAGNOSIS — R5383 Other fatigue: Secondary | ICD-10-CM | POA: Diagnosis not present

## 2023-04-09 DIAGNOSIS — E66811 Obesity, class 1: Secondary | ICD-10-CM | POA: Diagnosis not present

## 2023-04-09 DIAGNOSIS — Z9884 Bariatric surgery status: Secondary | ICD-10-CM | POA: Diagnosis not present

## 2023-04-09 DIAGNOSIS — Z87898 Personal history of other specified conditions: Secondary | ICD-10-CM | POA: Diagnosis not present

## 2023-04-18 DIAGNOSIS — Z9884 Bariatric surgery status: Secondary | ICD-10-CM | POA: Diagnosis not present

## 2023-05-09 DIAGNOSIS — E6609 Other obesity due to excess calories: Secondary | ICD-10-CM | POA: Diagnosis not present

## 2023-05-09 DIAGNOSIS — E66811 Obesity, class 1: Secondary | ICD-10-CM | POA: Diagnosis not present

## 2023-05-09 DIAGNOSIS — Z6832 Body mass index (BMI) 32.0-32.9, adult: Secondary | ICD-10-CM | POA: Diagnosis not present

## 2023-05-27 DIAGNOSIS — R0981 Nasal congestion: Secondary | ICD-10-CM | POA: Diagnosis not present

## 2023-05-27 DIAGNOSIS — R519 Headache, unspecified: Secondary | ICD-10-CM | POA: Diagnosis not present

## 2023-06-30 DIAGNOSIS — H6691 Otitis media, unspecified, right ear: Secondary | ICD-10-CM | POA: Diagnosis not present

## 2023-06-30 DIAGNOSIS — J0100 Acute maxillary sinusitis, unspecified: Secondary | ICD-10-CM | POA: Diagnosis not present

## 2023-07-21 DIAGNOSIS — Z6832 Body mass index (BMI) 32.0-32.9, adult: Secondary | ICD-10-CM | POA: Diagnosis not present

## 2023-07-21 DIAGNOSIS — F520 Hypoactive sexual desire disorder: Secondary | ICD-10-CM | POA: Diagnosis not present

## 2023-07-21 DIAGNOSIS — Z01419 Encounter for gynecological examination (general) (routine) without abnormal findings: Secondary | ICD-10-CM | POA: Diagnosis not present

## 2023-07-22 DIAGNOSIS — F411 Generalized anxiety disorder: Secondary | ICD-10-CM | POA: Diagnosis not present

## 2023-07-22 DIAGNOSIS — J329 Chronic sinusitis, unspecified: Secondary | ICD-10-CM | POA: Diagnosis not present

## 2023-07-22 DIAGNOSIS — R499 Unspecified voice and resonance disorder: Secondary | ICD-10-CM | POA: Diagnosis not present

## 2023-07-22 DIAGNOSIS — Z713 Dietary counseling and surveillance: Secondary | ICD-10-CM | POA: Diagnosis not present

## 2023-08-04 DIAGNOSIS — H8103 Meniere's disease, bilateral: Secondary | ICD-10-CM | POA: Diagnosis not present

## 2023-08-04 DIAGNOSIS — H9319 Tinnitus, unspecified ear: Secondary | ICD-10-CM | POA: Diagnosis not present

## 2023-08-04 DIAGNOSIS — J3089 Other allergic rhinitis: Secondary | ICD-10-CM | POA: Diagnosis not present

## 2023-08-04 DIAGNOSIS — R519 Headache, unspecified: Secondary | ICD-10-CM | POA: Diagnosis not present

## 2023-08-04 DIAGNOSIS — J3489 Other specified disorders of nose and nasal sinuses: Secondary | ICD-10-CM | POA: Diagnosis not present

## 2023-08-22 DIAGNOSIS — M542 Cervicalgia: Secondary | ICD-10-CM | POA: Diagnosis not present

## 2023-08-22 DIAGNOSIS — G43719 Chronic migraine without aura, intractable, without status migrainosus: Secondary | ICD-10-CM | POA: Diagnosis not present

## 2023-08-28 DIAGNOSIS — E669 Obesity, unspecified: Secondary | ICD-10-CM | POA: Diagnosis not present

## 2023-08-28 DIAGNOSIS — Z6832 Body mass index (BMI) 32.0-32.9, adult: Secondary | ICD-10-CM | POA: Diagnosis not present

## 2023-08-28 DIAGNOSIS — Z0000 Encounter for general adult medical examination without abnormal findings: Secondary | ICD-10-CM | POA: Diagnosis not present

## 2023-09-04 DIAGNOSIS — D2239 Melanocytic nevi of other parts of face: Secondary | ICD-10-CM | POA: Diagnosis not present

## 2023-09-04 DIAGNOSIS — L820 Inflamed seborrheic keratosis: Secondary | ICD-10-CM | POA: Diagnosis not present

## 2023-09-04 DIAGNOSIS — L821 Other seborrheic keratosis: Secondary | ICD-10-CM | POA: Diagnosis not present

## 2023-09-10 DIAGNOSIS — M542 Cervicalgia: Secondary | ICD-10-CM | POA: Diagnosis not present

## 2023-09-10 DIAGNOSIS — R519 Headache, unspecified: Secondary | ICD-10-CM | POA: Diagnosis not present

## 2023-09-10 DIAGNOSIS — G43719 Chronic migraine without aura, intractable, without status migrainosus: Secondary | ICD-10-CM | POA: Diagnosis not present

## 2023-09-11 DIAGNOSIS — G43719 Chronic migraine without aura, intractable, without status migrainosus: Secondary | ICD-10-CM | POA: Diagnosis not present

## 2023-09-11 DIAGNOSIS — M5382 Other specified dorsopathies, cervical region: Secondary | ICD-10-CM | POA: Diagnosis not present

## 2023-09-11 DIAGNOSIS — M542 Cervicalgia: Secondary | ICD-10-CM | POA: Diagnosis not present

## 2023-09-11 DIAGNOSIS — M25512 Pain in left shoulder: Secondary | ICD-10-CM | POA: Diagnosis not present

## 2023-09-11 DIAGNOSIS — Z7409 Other reduced mobility: Secondary | ICD-10-CM | POA: Diagnosis not present

## 2023-09-12 DIAGNOSIS — M542 Cervicalgia: Secondary | ICD-10-CM

## 2023-09-12 HISTORY — DX: Cervicalgia: M54.2

## 2023-09-23 DIAGNOSIS — Z7409 Other reduced mobility: Secondary | ICD-10-CM | POA: Diagnosis not present

## 2023-09-23 DIAGNOSIS — M25512 Pain in left shoulder: Secondary | ICD-10-CM | POA: Diagnosis not present

## 2023-09-23 DIAGNOSIS — M5382 Other specified dorsopathies, cervical region: Secondary | ICD-10-CM | POA: Diagnosis not present

## 2023-09-23 DIAGNOSIS — G43719 Chronic migraine without aura, intractable, without status migrainosus: Secondary | ICD-10-CM | POA: Diagnosis not present

## 2023-09-23 DIAGNOSIS — M542 Cervicalgia: Secondary | ICD-10-CM | POA: Diagnosis not present

## 2023-10-27 DIAGNOSIS — R002 Palpitations: Secondary | ICD-10-CM | POA: Diagnosis not present

## 2023-11-03 ENCOUNTER — Ambulatory Visit: Attending: Family

## 2023-11-03 ENCOUNTER — Telehealth: Payer: Self-pay | Admitting: *Deleted

## 2023-11-03 DIAGNOSIS — R002 Palpitations: Secondary | ICD-10-CM

## 2023-11-06 ENCOUNTER — Encounter: Payer: Self-pay | Admitting: Cardiology

## 2023-11-06 ENCOUNTER — Encounter: Payer: Self-pay | Admitting: *Deleted

## 2023-11-06 DIAGNOSIS — L709 Acne, unspecified: Secondary | ICD-10-CM | POA: Insufficient documentation

## 2023-11-06 DIAGNOSIS — F3342 Major depressive disorder, recurrent, in full remission: Secondary | ICD-10-CM | POA: Insufficient documentation

## 2023-11-06 DIAGNOSIS — R7301 Impaired fasting glucose: Secondary | ICD-10-CM

## 2023-11-06 DIAGNOSIS — G4733 Obstructive sleep apnea (adult) (pediatric): Secondary | ICD-10-CM | POA: Insufficient documentation

## 2023-11-06 DIAGNOSIS — K5900 Constipation, unspecified: Secondary | ICD-10-CM

## 2023-11-06 DIAGNOSIS — J302 Other seasonal allergic rhinitis: Secondary | ICD-10-CM

## 2023-11-06 DIAGNOSIS — H8109 Meniere's disease, unspecified ear: Secondary | ICD-10-CM | POA: Insufficient documentation

## 2023-11-06 DIAGNOSIS — E611 Iron deficiency: Secondary | ICD-10-CM | POA: Insufficient documentation

## 2023-11-06 DIAGNOSIS — E7800 Pure hypercholesterolemia, unspecified: Secondary | ICD-10-CM | POA: Insufficient documentation

## 2023-11-06 DIAGNOSIS — E559 Vitamin D deficiency, unspecified: Secondary | ICD-10-CM | POA: Insufficient documentation

## 2023-11-06 DIAGNOSIS — K219 Gastro-esophageal reflux disease without esophagitis: Secondary | ICD-10-CM | POA: Insufficient documentation

## 2023-11-06 DIAGNOSIS — R6882 Decreased libido: Secondary | ICD-10-CM

## 2023-11-06 DIAGNOSIS — Z9989 Dependence on other enabling machines and devices: Secondary | ICD-10-CM

## 2023-11-06 DIAGNOSIS — M2550 Pain in unspecified joint: Secondary | ICD-10-CM

## 2023-11-06 DIAGNOSIS — R2689 Other abnormalities of gait and mobility: Secondary | ICD-10-CM

## 2023-11-06 DIAGNOSIS — Z6841 Body Mass Index (BMI) 40.0 and over, adult: Secondary | ICD-10-CM

## 2023-11-06 DIAGNOSIS — N951 Menopausal and female climacteric states: Secondary | ICD-10-CM

## 2023-11-06 DIAGNOSIS — E786 Lipoprotein deficiency: Secondary | ICD-10-CM | POA: Insufficient documentation

## 2023-11-06 HISTORY — DX: Other abnormalities of gait and mobility: R26.89

## 2023-11-06 HISTORY — DX: Constipation, unspecified: K59.00

## 2023-11-06 HISTORY — DX: Dependence on other enabling machines and devices: Z99.89

## 2023-11-06 HISTORY — DX: Decreased libido: R68.82

## 2023-11-06 HISTORY — DX: Pain in unspecified joint: M25.50

## 2023-11-06 HISTORY — DX: Impaired fasting glucose: R73.01

## 2023-11-06 HISTORY — DX: Menopausal and female climacteric states: N95.1

## 2023-11-06 HISTORY — DX: Body Mass Index (BMI) 40.0 and over, adult: Z684

## 2023-11-06 HISTORY — DX: Other seasonal allergic rhinitis: J30.2

## 2023-11-16 NOTE — Progress Notes (Addendum)
 Cardiology Office Note:    Date:  11/21/2023   ID:  [name], DOB 06-16-1970, MRN [id number]  PCP:  Michelle Males, MD  Cardiologist:  Michelle Leiter, MD   Referring MD: Michelle Males, MD  ASSESSMENT:    1. Palpitations   2. Varicose veins of both lower extremities, unspecified whether complicated    PLAN:    In order of problems listed above:  I reviewed with the patient that is not uncommon for adults to have palpitation and fortunately her monitor falls within what I  consider normal for age I encouraged her to continue magnesium supplementation after bariatric surgery. Avoid over-the-counter proarrhythmic drugs given instructions A prescription for as needed beta-blocker if she had brief runs of APCs and she can use her discretion whether or not to take Echocardiograms for assessment of structural heart disease  Next appointment I will see her back to my office as needed if the echocardiogram is reassuring   Medication Adjustments/Labs and Tests Ordered: Current medicines are reviewed at length with the patient today.  Concerns regarding medicines are outlined above.  Orders Placed This Encounter  Procedures   EKG 12-Lead   ECHOCARDIOGRAM COMPLETE   Meds ordered this encounter  Medications   DISCONTD: acebutolol  (SECTRAL ) 200 MG capsule    Sig: Take 1 capsule (200 mg total) by mouth daily.    Dispense:  90 capsule    Refill:  3   acebutolol  (SECTRAL ) 200 MG capsule    Sig: Take 1 capsule (200 mg total) by mouth daily as needed.    Dispense:  30 capsule    Refill:  0     No chief complaint on file.   History of Present Illness:    [name] is a 53 y.o. female who is being seen today for the evaluation of palpitation at the request of Michelle Males, MD.  He was seen primary care physician 10/30/2023 compliant with palpitation multiple times a day lasting less than a minute generally open only momentary a few seconds with associated shortness of  breath.  Her history is noteworthy for sleep apnea although there is no history of hypertension she takes a thiazide diuretic and is taking antidepressant fluoxetine.  She had laboratory studies performed including a CMP with a sodium 140 potassium 4.0 creatinine 0.76 liver function test were normal lipid profile showed a cholesterol 146 LDL 77 non-HDL cholesterol 89 hemoglobin 13.7  She had a carotid duplex performed in August 2022 which showed no significant carotid atherosclerosis and a chest x-ray normal.  She was also noted to have a thyroid  nodule.  She has been seen by vascular surgery February 2024 for bilateral lower extremity varicose veins associated with obesity and previous gastric bypass surgery.  MRI brain performed for headache evaluation brain and vascular structures were normal.  Date 09/10/2023  08/13/2020 sinus rhythm normal including QRS morphology conduction intervals T waves and there is no conduction delay.  I did have the report of her monitor which she showed me the synopsis from her MyChart that arrived last evening and it showed the presence of rare supraventricular and ventricular arrhythmia and 2 episodes of brief runs of APCs I reviewed with her that this is not uncommon and nothing falls within what I would. She has an adult history of palpitation but recently worsened in the last 6 weeks.  Predominantly occurs at rest not severe or sustained she either describes extra beat or fluttering.  It seemed to worsen when she  took hydrochlorothiazide no longer taking it and laboratory studies did not show hypokalemia but she did not have her magnesium checked.  She is taking magnesium supplementation. 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   Gastric bypass status for obesity 01/01/2021   Gastroesophageal reflux disease 11/06/2023   Gestational diabetes    Imbalance 11/06/2023   Iron deficiency 11/06/2023   Joint pain 11/06/2023   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   Meniere's disease 11/06/2023   Menopausal and female climacteric states 11/06/2023   Migraine with vertigo 01/20/2012   Morbid obesity (HCC) 11/06/2012   Neck pain 09/12/2023   Obstructive sleep apnea syndrome 11/06/2023   PONV (postoperative nausea and vomiting)    Prediabetes    Pure hypercholesterolemia 11/06/2023   Recurrent major depression in full remission (HCC) 11/06/2023   Reduced libido 11/06/2023   Seasonal allergies 11/06/2023   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   Sleep apnea    Tinnitus 01/20/2012   Vertigo 01/20/2012   Vitamin D deficiency 11/06/2023    Past Surgical History:  Procedure Laterality Date   BREAST EXCISIONAL BIOPSY Left 2001   benign   CESAREAN SECTION N/A 2004 2010   x2   CHOLECYSTECTOMY  2006   DILATION AND EVACUATION N/A 10/14/2014   Procedure: DILATATION AND EVACUATION;  Surgeon: Alm Cook, MD;  Location: [facility];  Service: Gynecology;  Laterality: N/A;   GASTRIC ROUX-EN-Y N/A 01/01/2021   Procedure: LAPAROSCOPIC ROUX-EN-Y GASTRIC BYPASS WITH UPPER ENDOSCOPY;  Surgeon: Tanda Locus, MD;  Location: [facility];  Service: General;  Laterality: N/A;   NASAL SEPTUM SURGERY Bilateral 03/2006   WISDOM TOOTH EXTRACTION      Current Medications: Current Meds  Medication Sig   acebutolol  (SECTRAL ) 200 MG capsule Take 1  capsule (200 mg total) by mouth daily as needed.   Calcium Carbonate (CALCIUM 500 PO) Take 500 mg by mouth 3 (three) times daily.   clonazePAM  (KLONOPIN ) 0.5 MG tablet Take 0.5 mg by mouth daily as needed for anxiety.   FLUoxetine (PROZAC) 10 MG capsule Take 10 mg by mouth daily.   levonorgestrel  (MIRENA , 52 MG,) 20 MCG/DAY IUD 1 each by Intrauterine route once.   loratadine (CLARITIN) 10 MG tablet Take 10 mg by mouth daily.   meclizine  (ANTIVERT ) 25 MG tablet Take 25 mg by mouth 3 (three) times daily as needed for dizziness.   montelukast (SINGULAIR) 10 MG tablet Take 10 mg by mouth daily.   ondansetron  (ZOFRAN -ODT) 4 MG disintegrating tablet Take 1 tablet (4 mg total) by mouth every 6 (six) hours as needed for nausea or vomiting.   polyethylene glycol (MIRALAX  / GLYCOLAX ) 17 g packet Take 17 g by mouth daily as needed for mild constipation or moderate constipation.   tirzepatide (ZEPBOUND) 2.5 MG/0.5ML Pen Inject 2.5 mg into the skin once a week.   [DISCONTINUED] acebutolol  (SECTRAL ) 200 MG capsule Take 1 capsule (200 mg total) by  mouth daily.     Allergies:   Shellfish allergy   Social History   Socioeconomic History   Marital status: Married    Spouse name: Not on file   Number of children: Not on file   Years of education: Not on file   Highest education level: Not on file  Occupational History   Not on file  Tobacco Use   Smoking status: Never   Smokeless tobacco: Never  Vaping Use   Vaping status: Never Used  Substance and Sexual Activity   Alcohol  use: Not Currently   Drug use: Never   Sexual activity: Yes    Comment: husband had vasectomy  Other Topics Concern   Not on file  Social History Narrative   Not on file   Social Drivers of Health   Financial Resource Strain: Not on file  Food Insecurity: Low Risk  (08/04/2023)   Received from [facility]   Hunger Vital Sign    Within the past 12 months, you worried that your food would run out before you got money to  buy more: Never true    Within the past 12 months, the food you bought just didn't last and you didn't have money to get more. : Never true  Transportation Needs: No Transportation Needs (08/04/2023)   Received from [facility]    In the past 12 months, has lack of reliable transportation kept you from medical appointments, meetings, work or from getting things needed for daily living? : No  Physical Activity: Not on file  Stress: Not on file  Social Connections: Unknown (08/07/2021)   Received from [employer]   Social Network    Social Network: Not on file     Family History: The patient's family history includes BRCA 1/2 in her maternal grandmother; Barrett's esophagus in her father; Breast cancer in her maternal grandmother; Lung cancer in her paternal grandmother; Prostate cancer in her maternal grandfather.  ROS:   ROS Please see the history of present illness.     All other systems reviewed and are negative.  EKGs/Labs/Other Studies Reviewed:    The following studies were reviewed today: EKG Interpretation Date/Time:  Friday November 21 2023 08:41:30 EDT Ventricular Rate:  74 PR Interval:  128 QRS Duration:  78 QT Interval:  374 QTC Calculation: 415 R Axis:   44  Text Interpretation: Normal sinus rhythm with sinus arrhythmia Normal ECG When compared with ECG of 16-Aug-2020 09:24, No significant change was found Confirmed by Monetta Rogue ([id number]) on 11/21/2023 8:49:20 AM     Physical Exam:    VS:  BP 108/70 (Cuff Size: Small)   Pulse 74   Ht 5' 3 (1.6 m)   Wt 177 lb 6.4 oz (80.5 kg)   SpO2 99%   BMI 31.42 kg/m     Wt Readings from Last 3 Encounters:  11/21/23 177 lb 6.4 oz (80.5 kg)  10/27/23 180 lb (81.6 kg)  07/02/22 179 lb 12.8 oz (81.6 kg)     GEN: No xanthoma xanthelasma well nourished, well developed in no acute distress HEENT: Normal NECK: No JVD; No carotid bruits LYMPHATICS: No lymphadenopathy CARDIAC: RRR, no murmurs, rubs,  gallops RESPIRATORY:  Clear to auscultation without rales, wheezing or rhonchi  ABDOMEN: Soft, non-tender, non-distended MUSCULOSKELETAL:  No edema; No deformity  SKIN: Warm and dry NEUROLOGIC:  Alert and oriented x 3 PSYCHIATRIC:  Normal affect     Signed, Rogue Monetta, MD  11/21/2023 9:29 AM  [facility]

## 2023-11-17 ENCOUNTER — Ambulatory Visit: Admitting: Cardiology

## 2023-11-17 NOTE — Telephone Encounter (Signed)
 Pt scheduled with Dr. Armin Zio ordered to be mailed to pt

## 2023-11-18 DIAGNOSIS — M79662 Pain in left lower leg: Secondary | ICD-10-CM | POA: Diagnosis not present

## 2023-11-18 DIAGNOSIS — I87393 Chronic venous hypertension (idiopathic) with other complications of bilateral lower extremity: Secondary | ICD-10-CM | POA: Diagnosis not present

## 2023-11-18 DIAGNOSIS — M79604 Pain in right leg: Secondary | ICD-10-CM | POA: Diagnosis not present

## 2023-11-18 DIAGNOSIS — M79661 Pain in right lower leg: Secondary | ICD-10-CM | POA: Diagnosis not present

## 2023-11-18 DIAGNOSIS — I83893 Varicose veins of bilateral lower extremities with other complications: Secondary | ICD-10-CM | POA: Diagnosis not present

## 2023-11-18 DIAGNOSIS — G4733 Obstructive sleep apnea (adult) (pediatric): Secondary | ICD-10-CM | POA: Diagnosis not present

## 2023-11-19 DIAGNOSIS — R002 Palpitations: Secondary | ICD-10-CM | POA: Diagnosis not present

## 2023-11-20 DIAGNOSIS — R002 Palpitations: Secondary | ICD-10-CM | POA: Diagnosis not present

## 2023-11-20 DIAGNOSIS — O24419 Gestational diabetes mellitus in pregnancy, unspecified control: Secondary | ICD-10-CM | POA: Insufficient documentation

## 2023-11-20 DIAGNOSIS — Z9889 Other specified postprocedural states: Secondary | ICD-10-CM | POA: Insufficient documentation

## 2023-11-21 ENCOUNTER — Encounter: Payer: Self-pay | Admitting: Cardiology

## 2023-11-21 ENCOUNTER — Ambulatory Visit: Attending: Cardiology | Admitting: Cardiology

## 2023-11-21 VITALS — BP 108/70 | HR 74 | Ht 63.0 in | Wt 177.4 lb

## 2023-11-21 DIAGNOSIS — I8393 Asymptomatic varicose veins of bilateral lower extremities: Secondary | ICD-10-CM | POA: Diagnosis not present

## 2023-11-21 DIAGNOSIS — R002 Palpitations: Secondary | ICD-10-CM

## 2023-11-21 MED ORDER — ACEBUTOLOL HCL 200 MG PO CAPS: 200.0000 mg | ORAL_CAPSULE | Freq: Every day | ORAL | 0 refills | Status: AC | PRN

## 2023-11-21 MED ORDER — ACEBUTOLOL HCL 200 MG PO CAPS: 200.0000 mg | ORAL_CAPSULE | Freq: Every day | ORAL | 3 refills | Status: DC

## 2023-11-21 NOTE — Patient Instructions (Signed)
 Medication Instructions:  Your physician has recommended you make the following change in your medication:   START: Sectral  200 mg daily as needed  *If you need a refill on your cardiac medications before your next appointment, please call your pharmacy*  Lab Work: None If you have labs (blood work) drawn today and your tests are completely normal, you will receive your results only by: MyChart Message (if you have MyChart) OR A paper copy in the mail If you have any lab test that is abnormal or we need to change your treatment, we will call you to review the results.  Testing/Procedures: Your physician has requested that you have an echocardiogram. Echocardiography is a painless test that uses sound waves to create images of your heart. It provides your doctor with information about the size and shape of your heart and how well your heart's chambers and valves are working. This procedure takes approximately one hour. There are no restrictions for this procedure. Please do NOT wear cologne, perfume, aftershave, or lotions (deodorant is allowed). Please arrive 15 minutes prior to your appointment time.  Please note: We ask at that you not bring children with you during ultrasound (echo/ vascular) testing. Due to room size and safety concerns, children are not allowed in the ultrasound rooms during exams. Our front office staff cannot provide observation of children in our lobby area while testing is being conducted. An adult accompanying a patient to their appointment will only be allowed in the ultrasound room at the discretion of the ultrasound technician under special circumstances. We apologize for any inconvenience.   Follow-Up: At [facility], you and your health needs are our priority.  As part of our continuing mission to provide you with exceptional heart care, our providers are all part of one team.  This team includes your primary Cardiologist (physician) and Advanced  Practice Providers or APPs (Physician Assistants and Nurse Practitioners) who all work together to provide you with the care you need, when you need it.  Your next appointment:   Follow up as needed   Provider:   Redell Leiter, MD    We recommend signing up for the patient portal called MyChart.  Sign up information is provided on this After Visit Summary.  MyChart is used to connect with patients for Virtual Visits (Telemedicine).  Patients are able to view lab/test results, encounter notes, upcoming appointments, etc.  Non-urgent messages can be sent to your provider as well.   To learn more about what you can do with MyChart, go to [email or web address].   Other Instructions Continue to take daily magnesium    1. Avoid all over-the-counter antihistamines except Claritin/Loratadine and Zyrtec/Cetrizine. 2. Avoid all combination including cold sinus allergies flu decongestant and sleep medications 3. You can use Robitussin DM Mucinex and Mucinex DM for cough. 4. can use Tylenol  aspirin ibuprofen and naproxen but no combinations such as sleep or sinus.

## 2023-11-24 HISTORY — PX: OTHER SURGICAL HISTORY: SHX169

## 2023-12-01 DIAGNOSIS — K219 Gastro-esophageal reflux disease without esophagitis: Secondary | ICD-10-CM | POA: Diagnosis not present

## 2023-12-08 DIAGNOSIS — I83891 Varicose veins of right lower extremities with other complications: Secondary | ICD-10-CM | POA: Diagnosis not present

## 2023-12-09 DIAGNOSIS — I83891 Varicose veins of right lower extremities with other complications: Secondary | ICD-10-CM | POA: Diagnosis not present

## 2023-12-09 DIAGNOSIS — Z09 Encounter for follow-up examination after completed treatment for conditions other than malignant neoplasm: Secondary | ICD-10-CM | POA: Diagnosis not present

## 2023-12-09 DIAGNOSIS — I83892 Varicose veins of left lower extremities with other complications: Secondary | ICD-10-CM | POA: Diagnosis not present

## 2023-12-15 DIAGNOSIS — I83891 Varicose veins of right lower extremities with other complications: Secondary | ICD-10-CM | POA: Diagnosis not present

## 2023-12-15 DIAGNOSIS — Z09 Encounter for follow-up examination after completed treatment for conditions other than malignant neoplasm: Secondary | ICD-10-CM | POA: Diagnosis not present

## 2023-12-16 DIAGNOSIS — I83892 Varicose veins of left lower extremities with other complications: Secondary | ICD-10-CM | POA: Diagnosis not present

## 2023-12-18 DIAGNOSIS — L570 Actinic keratosis: Secondary | ICD-10-CM | POA: Diagnosis not present

## 2023-12-18 DIAGNOSIS — L821 Other seborrheic keratosis: Secondary | ICD-10-CM | POA: Diagnosis not present

## 2023-12-18 DIAGNOSIS — D485 Neoplasm of uncertain behavior of skin: Secondary | ICD-10-CM | POA: Diagnosis not present

## 2023-12-18 DIAGNOSIS — D2239 Melanocytic nevi of other parts of face: Secondary | ICD-10-CM | POA: Diagnosis not present

## 2023-12-18 DIAGNOSIS — L814 Other melanin hyperpigmentation: Secondary | ICD-10-CM | POA: Diagnosis not present

## 2023-12-18 DIAGNOSIS — D225 Melanocytic nevi of trunk: Secondary | ICD-10-CM | POA: Diagnosis not present

## 2023-12-19 ENCOUNTER — Ambulatory Visit: Attending: Cardiology

## 2023-12-19 DIAGNOSIS — R002 Palpitations: Secondary | ICD-10-CM

## 2023-12-19 DIAGNOSIS — I8393 Asymptomatic varicose veins of bilateral lower extremities: Secondary | ICD-10-CM | POA: Diagnosis not present

## 2023-12-19 LAB — ECHOCARDIOGRAM COMPLETE
Area-P 1/2: 4.23 cm2
S' Lateral: 2.6 cm

## 2023-12-20 ENCOUNTER — Ambulatory Visit: Payer: Self-pay | Admitting: Cardiology

## 2023-12-22 ENCOUNTER — Telehealth: Payer: Self-pay | Admitting: Gastroenterology

## 2023-12-22 NOTE — Telephone Encounter (Signed)
OK to schedule in APP clinic or mine, whichever is earlier [employer]

## 2023-12-22 NOTE — Telephone Encounter (Signed)
 Pt returning call to a nurse

## 2023-12-22 NOTE — Telephone Encounter (Signed)
 Good Morning Dr. Charlanne,   I received a call from this patient requesting to schedule an appointment with you for a her GERD symptoms. Patient was seen with [facility] for a colonoscopy. Those records can be view on media tab in [facility]. Would you please advise on schedule this patient.     Thank you.

## 2023-12-23 ENCOUNTER — Encounter: Payer: Self-pay | Admitting: Physician Assistant

## 2023-12-24 HISTORY — PX: OTHER SURGICAL HISTORY: SHX169

## 2024-01-05 DIAGNOSIS — I83892 Varicose veins of left lower extremities with other complications: Secondary | ICD-10-CM | POA: Diagnosis not present

## 2024-01-07 ENCOUNTER — Other Ambulatory Visit: Payer: Self-pay | Admitting: General Surgery

## 2024-01-07 DIAGNOSIS — Z6832 Body mass index (BMI) 32.0-32.9, adult: Secondary | ICD-10-CM | POA: Diagnosis not present

## 2024-01-07 DIAGNOSIS — Z9884 Bariatric surgery status: Secondary | ICD-10-CM

## 2024-01-07 DIAGNOSIS — R12 Heartburn: Secondary | ICD-10-CM | POA: Diagnosis not present

## 2024-01-07 DIAGNOSIS — E6609 Other obesity due to excess calories: Secondary | ICD-10-CM | POA: Diagnosis not present

## 2024-01-08 DIAGNOSIS — I83891 Varicose veins of right lower extremities with other complications: Secondary | ICD-10-CM | POA: Diagnosis not present

## 2024-01-08 DIAGNOSIS — I872 Venous insufficiency (chronic) (peripheral): Secondary | ICD-10-CM | POA: Diagnosis not present

## 2024-01-15 ENCOUNTER — Inpatient Hospital Stay
Admission: RE | Admit: 2024-01-15 | Discharge: 2024-01-15 | Disposition: A | Source: Ambulatory Visit | Attending: General Surgery | Admitting: General Surgery

## 2024-01-15 DIAGNOSIS — Z9884 Bariatric surgery status: Secondary | ICD-10-CM

## 2024-01-15 DIAGNOSIS — R1310 Dysphagia, unspecified: Secondary | ICD-10-CM | POA: Diagnosis not present

## 2024-01-23 ENCOUNTER — Ambulatory Visit: Payer: Self-pay | Admitting: General Surgery

## 2024-02-04 NOTE — Progress Notes (Addendum)
 02/05/2024 [name] [id number] 09-13-1970  Referring provider: Gayl Males, MD Primary GI doctor: Dr. Suzann  ASSESSMENT AND PLAN:  LPR, reflux, occasional dysphagia Status post gastric bypass 2022 01/15/2024 upper GI Dr. Tanda mild dysmotility seen in 1 out of 3 swallows otherwise normal upper GI series post gastric bypass Started after 2022, stopped all GERD meds, having hoarseness since that time, has seen ENT with normal exam 2022, worsening upper esophageal solid foods, have to drink water , throat clearing, early satiety  she is on zepbound 2.5 x 1 year Dad with Barrett's, no NSAIDS, ETOH, tobacco - on pantoprazole  40 mg BID with some help -alginate therapy given -Lifestyle changes discussed, hand out given to the patient -Schedule EGD at [facility] to evaluate GERD, esophagitis, hiatal hernia,H pylori. I discussed risks of EGD with patient today, including risk of sedation, bleeding or perforation. Patient provides understanding and gave verbal consent to proceed. - consider barium swallow versus ph/manometry pending results -Gastroparesis diet given and information with zepbound - Provided information on SIBO and potential testing options. - Will consider breath test for SIBO if symptoms persist after other interventions. - may need to consider trial off zepbound  Constipation with rare diarrhea, possible IBS-C Chronic constipation with intermittent diarrhea, likely related to Zepbound. Symptoms include large stools and occasional diarrhea, possibly exacerbated by Zepbound. - Recommended bowel purge with Miralax  and fiber. - Provided bowel prep instructions. - Will consider trial off Zepbound if symptoms persist. - consider SIBO testing/treatment   Screening colonoscopy 08/2020 colonoscopy good prep 2 mm polyp ascending colon, 4 mm polyp ascending, recall 3 years 1 TA polyp, 1 inflammatory polyp No family history of colon cancer Recall 7 years  History of  gastric bypass November 2022 Dr. Tanda  Palpitations 11/21/2023 echocardiogram EF 60 to 65% unremarkable valves Possibly secondary to esophageal spasm If EGD negative consider ph/manometry  I have reviewed the clinic note as outlined by [name] and agree with the assessment, plan and medical decision making.  [name] reports a history of GERD-underwent gastric bypass 2022 after which she discontinued reflux medications.  Complains of hoarseness and dysphagia.  Agree with EGD and possible dilation.  Constipation consistent with IBS-C-agree with management.  History of tubular adenoma on colonoscopy 2022-repeat in 2029.  Michelle Suzann, MD   Patient Care Team: Michelle Males, MD as PCP - General (Family Medicine)  HISTORY OF PRESENT ILLNESS: 53 y.o. female with a past medical history listed below presents for evaluation of LPR.   Discussed the use of AI scribe software for clinical note transcription with the patient, who gave verbal consent to proceed.  History of Present Illness   [name] is a 53 year old female with a history of gastric bypass who presents with gastrointestinal symptoms including constipation, diarrhea, and dysphagia.  She underwent gastric bypass surgery in 2022, after which she discontinued medications for acid reflux. Since then, she has experienced intermittent hoarseness and a sensation of needing to clear her throat, which began approximately six months ago. An ENT scope in 2023 showed no abnormalities. She describes a sensation of food sticking in her throat, particularly with certain foods like white meat chicken or steak, and occasionally experiences heart palpitations. A cardiologist evaluated her with a normal echocardiogram in January 2025.  She has a history of constipation, characterized by large, hard bowel movements three to four times a week, with occasional bouts of diarrhea approximately once a week. She sometimes uses Miralax  but  not consistently. Linzess was prescribed but not approved by insurance. She reports abdominal cramping associated with these symptoms.  In 2022, she underwent a colonoscopy where two polyps were removed, one tubular adenomatous and one inflammatory. There was confusion regarding the follow-up interval, as she was initially told three years but later told seven years. No family history of colon cancer and no bleeding, though she has experienced fresh blood from fissures.  She is currently on pantoprazole  40 mg twice daily, which has provided some improvement in her symptoms. She also started Prilosec prior to seeing her doctor and has been on Zepbound 2.5 mg weekly since October 2024, which she feels has helped regulate her symptoms post-surgery. No significant bloating or discomfort but notes a persistent white coating on her tongue and a recurring bump in her throat.  Her father has a history of Barrett's esophagus requiring regular endoscopies. She does not smoke or consume alcohol  and avoids NSAIDs due to her gastric bypass.     She  reports that she has never smoked. She has never used smokeless tobacco. She reports that she does not currently use alcohol . She reports that she does not use drugs.  RELEVANT GI HISTORY, IMAGING AND LABS: Results   LABS T3: Slightly low (10/2023)  DIAGNOSTIC Colonoscopy: Two polyps removed; one tubular adenomatous polyp, one inflammatory polyp (2022) Echocardiogram: Normal (04/23/2023) Laryngoscopy: No abnormalities (2023)      CBC    Component Value Date/Time   WBC 9.3 01/03/2021 0426   RBC 4.48 01/03/2021 0426   HGB 12.7 01/03/2021 0426   HCT 40.1 01/03/2021 0426   PLT 188 01/03/2021 0426   MCV 89.5 01/03/2021 0426   MCH 28.3 01/03/2021 0426   MCHC 31.7 01/03/2021 0426   RDW 14.0 01/03/2021 0426   LYMPHSABS 3.1 01/03/2021 0426   MONOABS 0.7 01/03/2021 0426   EOSABS 0.1 01/03/2021 0426   BASOSABS 0.0 01/03/2021 0426   No results for input(s):  HGB in the last 8760 hours.  CMP     Component Value Date/Time   NA 136 01/02/2021 0356   K 4.0 01/02/2021 0356   CL 106 01/02/2021 0356   CO2 23 01/02/2021 0356   GLUCOSE 187 (H) 01/02/2021 0356   BUN 8 01/02/2021 0356   CREATININE 0.58 01/02/2021 0356   CALCIUM 8.4 (L) 01/02/2021 0356   PROT 7.0 01/02/2021 0356   ALBUMIN 3.7 01/02/2021 0356   AST 32 01/02/2021 0356   ALT 37 01/02/2021 0356   ALKPHOS 45 01/02/2021 0356   BILITOT 0.8 01/02/2021 0356   GFRNONAA >60 01/02/2021 0356   GFRAA  04/30/2008 1714    >60        The eGFR has been calculated using the MDRD equation. This calculation has not been validated in all clinical situations. eGFR's persistently <60 mL/min signify possible Chronic Kidney Disease.      Latest Ref Rng & Units 01/02/2021    3:56 AM 12/27/2020   11:08 AM 04/30/2008    5:14 PM  Hepatic Function  Total Protein 6.5 - 8.1 g/dL 7.0  7.9  6.1   Albumin 3.5 - 5.0 g/dL 3.7  4.2  2.9   AST 15 - 41 U/L 32  33  33   ALT 0 - 44 U/L 37  38  35   Alk Phosphatase 38 - 126 U/L 45  56  98   Total Bilirubin 0.3 - 1.2 mg/dL 0.8  0.7  0.4       Current Medications:  Current Outpatient Medications (Endocrine & Metabolic):    levonorgestrel  (MIRENA , 52 MG,) 20 MCG/DAY IUD, 1 each by Intrauterine route once.  Current Outpatient Medications (Cardiovascular):    minoxidil (LONITEN) 2.5 MG tablet, Take 2.5 mg by mouth daily.   acebutolol  (SECTRAL ) 200 MG capsule, Take 1 capsule (200 mg total) by mouth daily as needed. (Patient not taking: Reported on 02/05/2024)  Current Outpatient Medications (Respiratory):    loratadine (CLARITIN) 10 MG tablet, Take 10 mg by mouth daily.   montelukast (SINGULAIR) 10 MG tablet, Take 10 mg by mouth daily.    Current Outpatient Medications (Other):    Calcium Carbonate (CALCIUM 500 PO), Take 500 mg by mouth 3 (three) times daily.   clonazePAM  (KLONOPIN ) 0.5 MG tablet, Take 0.5 mg by mouth daily as needed for anxiety.    FLUoxetine (PROZAC) 10 MG capsule, Take 10 mg by mouth daily.   meclizine  (ANTIVERT ) 25 MG tablet, Take 25 mg by mouth 3 (three) times daily as needed for dizziness.   ondansetron  (ZOFRAN -ODT) 4 MG disintegrating tablet, Take 1 tablet (4 mg total) by mouth every 6 (six) hours as needed for nausea or vomiting.   pantoprazole  (PROTONIX ) 40 MG tablet, Take 40 mg by mouth 2 (two) times daily.   polyethylene glycol (MIRALAX  / GLYCOLAX ) 17 g packet, Take 17 g by mouth daily as needed for mild constipation or moderate constipation.   tirzepatide (ZEPBOUND) 2.5 MG/0.5ML Pen, Inject 2.5 mg into the skin once a week.  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   Meniere's disease 11/06/2023   Menopausal and female climacteric states 11/06/2023   Migraine with vertigo 01/20/2012   Morbid obesity (HCC) 11/06/2012   Neck pain 09/12/2023   Obstructive sleep apnea syndrome 11/06/2023   PONV (postoperative nausea and vomiting)    Prediabetes    Pure hypercholesterolemia 11/06/2023   Recurrent major depression in full remission 11/06/2023   Reduced libido 11/06/2023   Seasonal allergies 11/06/2023   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   Sleep apnea    Tinnitus 01/20/2012   Vertigo 01/20/2012   Vitamin D deficiency 11/06/2023   Allergies:  Allergies  Allergen Reactions    Shellfish Allergy Hives     Surgical History:  She  has a past surgical history that includes Cholecystectomy (2006); Nasal septum surgery (Bilateral, 03/2006); Cesarean section (N/A, 2004 2010); Dilation and evacuation (N/A, 10/14/2014); Breast excisional biopsy (Left, 2001); Wisdom tooth extraction; Gastric Roux-En-Y (N/A, 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 Family History:  Her family history includes BRCA 1/2 in her maternal grandmother; Barrett's esophagus in her father; Breast cancer in her maternal grandmother; Hypertension in her mother; Lung cancer in her paternal grandmother; Prostate cancer in her maternal grandfather.  REVIEW OF SYSTEMS  : All other systems reviewed and negative except where noted in the History of Present Illness.  PHYSICAL EXAM: BP 110/72   Pulse 70   Ht  5' 3 (1.6 m)   Wt 183 lb 6 oz (83.2 kg)   BMI 32.48 kg/m  Physical Exam   GENERAL APPEARANCE: Well nourished, in no apparent distress HEENT: No cervical lymphadenopathy, unremarkable thyroid , sclerae anicteric, conjunctiva pink RESPIRATORY: Respiratory effort normal, BS equal bilateral without rales, rhonchi, wheezing CARDIO: RRR with no MRGs, peripheral pulses intact ABDOMEN: Soft, non distended, active bowel sounds in all 4 quadrants, no tenderness to palpation, no rebound, no mass appreciated RECTAL: declines MUSCULOSKELETAL: Full ROM, normal gait, without edema SKIN: Dry, intact without rashes or lesions. No jaundice. NEURO: Alert, oriented, no focal deficits PSYCH: Cooperative, normal mood and affect.      Michelle JONELLE Coombs, Kelly-C 11:29 AM

## 2024-02-05 ENCOUNTER — Encounter: Payer: Self-pay | Admitting: Physician Assistant

## 2024-02-05 ENCOUNTER — Ambulatory Visit (INDEPENDENT_AMBULATORY_CARE_PROVIDER_SITE_OTHER): Admitting: Physician Assistant

## 2024-02-05 VITALS — BP 110/72 | HR 70 | Ht 63.0 in | Wt 183.4 lb

## 2024-02-05 DIAGNOSIS — R194 Change in bowel habit: Secondary | ICD-10-CM

## 2024-02-05 DIAGNOSIS — R1310 Dysphagia, unspecified: Secondary | ICD-10-CM

## 2024-02-05 DIAGNOSIS — Z9884 Bariatric surgery status: Secondary | ICD-10-CM | POA: Diagnosis not present

## 2024-02-05 DIAGNOSIS — I872 Venous insufficiency (chronic) (peripheral): Secondary | ICD-10-CM | POA: Diagnosis not present

## 2024-02-05 DIAGNOSIS — I8003 Phlebitis and thrombophlebitis of superficial vessels of lower extremities, bilateral: Secondary | ICD-10-CM | POA: Diagnosis not present

## 2024-02-05 DIAGNOSIS — K219 Gastro-esophageal reflux disease without esophagitis: Secondary | ICD-10-CM | POA: Diagnosis not present

## 2024-02-05 DIAGNOSIS — Z860101 Personal history of adenomatous and serrated colon polyps: Secondary | ICD-10-CM

## 2024-02-05 NOTE — Patient Instructions (Addendum)
 You have been scheduled for an endoscopy. Please follow written instructions given to you at your visit today.  If you use inhalers (even only as needed), please bring them with you on the day of your procedure.  If you take any of the following medications, they will need to be adjusted prior to your procedure:   DO NOT TAKE 7 DAYS PRIOR TO TEST- Trulicity (dulaglutide) Ozempic, Wegovy (semaglutide) Mounjaro, Zepbound (tirzepatide) Bydureon Bcise (exanatide extended release)  DO NOT TAKE 1 DAY PRIOR TO YOUR TEST Rybelsus (semaglutide) Adlyxin (lixisenatide) Victoza (liraglutide) Byetta (exanatide) ___________________________________________________________________________    Silent reflux: Not all heartburn burns...SABRASABRASABRA  What is LPR? Laryngopharyngeal reflux (LPR) or silent reflux is a condition in which acid that is made in the stomach travels up the esophagus (swallowing tube) and gets to the throat. Not everyone with reflux has a lot of heartburn or indigestion. In fact, many people with LPR never have heartburn. This is why LPR is called SILENT REFLUX, and the terms Silent reflux and LPR are often used interchangeably. Because LPR is silent, it is sometimes difficult to diagnose.  How can you tell if you have LPR?  Chronic hoarseness- Some people have hoarseness that comes and goes throat clearing  Cough It can cause shortness of breath and cause asthma like symptoms. a feeling of a lump in the throat  difficulty swallowing a problem with too much nose and throat drainage.  Some people will feel their esophagus spasm which feels like their heart beating hard and fast, this will usually be after a meal, at rest, or lying down at night.    How do I treat this? Treatment for LPR should be individualized, and your doctor will suggest the best treatment for you. Generally there are several treatments for LPR: changing habits and diet to reduce reflux,  medications to reduce  stomach acid, and  surgery to prevent reflux. Most people with LPR need to modify how and when they eat, as well as take some medication, to get well. Sometimes, nonprescription liquid antacids, such as Maalox, Gelucil and Mylanta are recommended. When used, these antacids should be taken four times each day - one tablespoon one hour after each meal and before bedtime. Dietary and lifestyle changes alone are not often enough to control LPR - medications that reduce stomach acid are also usually needed. These must be prescribed by our doctor.   TIPS FOR REDUCING REFLUX AND LPR Control your LIFE-STYLE and your DIET! If you use tobacco, QUIT.  Smoking makes you reflux. After every cigarette you have some LPR.  Don't wear clothing that is too tight, especially around the waist (trousers, corsets, belts).  Do not lie down just after eating...in fact, do not eat within three hours of bedtime.  You should be on a low-fat diet.  Limit your intake of red meat.  Limit your intake of butter.  Avoid fried foods.  Avoid chocolate  Avoid cheese.  Avoid eggs. Specifically avoid caffeine (especially coffee and tea), soda pop (especially cola) and mints.  Avoid alcoholic beverages, particularly in the evening.  Reflux Gourmet Rescue  It is an ALGINATE THERAPY which is the only intervention that works to safeguard the esophagus by creating a protective barrier that actually stops reflux from happening. -The general directions for use are as stated on the packaging: Take 1 teaspoon (5 ml), or more as needed or as directed by your physician, after meals and before bed. -These general directions address the most common times  for reflux to occur, but our Rescue products may be taken anytime. Some individuals may take our product preemptively, when they know they will suffer from reflux, or as needed - when discomfort arises. (If taken around food, it should be consumed last.) -You do not have to take 1  teaspoon (5 ml) of the product. While one teaspoon (5ml) may be the perfect average amount to relieve reflux suffering in some, others may require more or less. You may adjust the amount of Mint Chocolate Rescue and Vanilla Caramel Rescue to the lowest amount necessary to meet your individual needs to improve your quality of life. -You may dilute the product if it is too viscous for you to consume. Keep in mind, however, that the thickness of the product was formulated to provide optimal coating and protection of your throat and esophagus. Though diluting the product is possible, it may reduce the protective function and/or length of action. -This can be used in conjunction with reflux medications and lifestyle changes.  100% ALL-NATURAL  Paraben FREE, glycerin FREE, & potassium FREE  Made entirely from all-natural ingredients considered safe for children and during pregnancy  No known side effects  All-natural flavor Gluten FREE  Allergen FREE  Vegan  Can find more information here: nameseizer.co.nz  Understanding Your Weekly GLP-1 Injection  A helpful guide to managing common side effects  You are on a once-weekly injectable medication in the GLP-1 receptor agonist class. These medications can be very effective for blood sugar control, weight loss, and heart protection, fatty liver or OSA, but they can also come with some side effects that are important to understand. The good news is: most side effects can be managed with a few adjustments.  1. Gastroparesis-Like Symptoms These medications slow down your stomach to help you feel full longer -- great for weight loss and blood sugar control, but they can sometimes cause symptoms that feel like gastroparesis (slow stomach emptying). Symptoms may include: -Feeling full quickly when eating -Nausea or vomiting -Bloating or abdominal discomfort -Worsening heartburn or reflux -Acid regurgitation -Stomach  spasms or tightness What you can do: ??? Eat small, frequent meals (4-6 per day) ?? Drink fluids between meals, not during ?? Avoid high-fiber foods (like raw veggies or whole grains); cook your veggies well ?? Spread protein throughout the day (try Greek yogurt, eggs, soft meats, Glucerna, milk) ?? Choose soft foods you can mash with a fork ?? Switch to pured foods or liquids during flare-ups ?? Consider reading: Living Well with Gastroparesis by Camelia Bone ?? Downloadable Diet Guide: [facility] Gastroparesis Diet PDF  ?? Tip: Try following a gastroparesis-friendly diet on the day of your injection and the day after, when the medication's effect is strongest.  2. Constipation Since this medication slows down your digestive system, constipation is very common. Tips to help: ?? Drink plenty of water  ???? Stay active with regular exercise ?? Add fiber-rich but gentle foods like kiwi ?? Try a low-dose magnesium supplement at night ?? Use MiraLAX  (half to one capful daily) if constipation becomes more frequent, especially if your dose increases  If these strategies don't help, talk to your provider -- they may recommend or prescribe other treatments.  3. When to Call the Doctor or Go to the ER While rare, this medication can slightly increase your risk of serious conditions like: Pancreatitis (inflammation of the pancreas) Gallstones or gallbladder problems Watch for these signs and seek help if you experience: Severe abdominal pain (especially in the upper  belly or that radiates to your back) Pain in the right upper side of your abdomen Nausea, vomiting, fever, or chills that don't go away ?? Call your provider or go to the ER if these occur.  4. Who Should NOT Take This Medication? This medication should be avoided if you have: A personal history of pancreatitis  A personal or family history of medullary thyroid  cancer A condition called Multiple Endocrine Neoplasia  Syndrome Type 2 (MEN2)  Final Note If the side effects are too bothersome, remember: Most symptoms will go away if you stop the medication. But many people tolerate it well after the first few weeks, especially with the right strategies in place.  Please do the following: Purchase a bottle of Miralax  over the counter as well as a box of 5 mg dulcolax tablets. Take 4 dulcolax tablets. Wait 1 hour. You will then drink 6-8 capfuls of Miralax  mixed in an adequate amount of water /juice/gatorade (you may choose which of these liquids to drink) over the next 2-3 hours. You should expect results within 1 to 6 hours after completing the bowel purge. Go to the er if you have severe AB pain, can not pass gas or stool in over 12 hours, can not hold down any food.   Miralax  is an osmotic laxative.  It only brings more water  into the stool.  This is safe to take daily.  Can take up to 17 gram of miralax  twice a day.  Mix with juice or coffee.  Start 1 capful at night for 3-4 days and reassess your response in 3-4 days.  You can increase and decrease the dose based on your response.  Remember, it can take up to 3-4 days to take effect OR for the effects to wear off.   I often pair this with benefiber in the morning to help assure the stool is not too loose.  FIBER SUPPLEMENT You can do metamucil or fibercon once or twice a day but if this causes gas/bloating please switch to Benefiber or Citracel.  Fiber is good for constipation/diarrhea/irritable bowel syndrome.  It can also help with weight loss and can help lower your bad cholesterol (LDL).  Please do 1 TBSP in the morning in water , coffee, or tea.  It can take up to a month before you can see a difference with your bowel movements.  It is cheapest from [employer], [employer], [employer].   Small intestinal bacterial overgrowth (SIBO) occurs when there is an abnormal increase in the overall bacterial population in the small intestine -- particularly types of  bacteria not commonly found in that part of the digestive tract. Small intestinal bacterial overgrowth (SIBO) commonly results when a circumstance -- such as surgery or disease -- slows the passage of food and waste products in the digestive tract, creating a breeding ground for bacteria.  Signs and symptoms of SIBO often include: Loss of appetite Abdominal pain Nausea Bloating An uncomfortable feeling of fullness after eating Diarrhea or constipation, depending on the type of gas produced  What foods trigger SIBO? While foods aren't the original cause of SIBO, certain foods do encourage the overgrowth of the wrong bacteria in your small intestine. If you're feeding them their favorite foods, they're going to grow more, and that will trigger more of your SIBO symptoms. By the same token, you can help reduce the overgrowth by starving the problematic bacteria of their favorite foods. This strategy has led to a number of proposed SIBO eating plans. The plans  vary, and so do individual results. But in general, they tend to recommend limiting carbohydrates.  These include: Sugars and sweeteners. Fruits and starchy vegetables. Dairy products. Grains.  There is a test for this we can do called a breath test, if you are positive we will treat you with an antibiotic to see if it helps.  Your symptoms are very suspicious for this condition, as discussed, we will start you on an antibiotic to see if this helps.   Thank you for trusting me with your gastrointestinal care!   Alan Coombs, PA-C   _______________________________________________________  If your blood pressure at your visit was 140/90 or greater, please contact your primary care physician to follow up on this.  _______________________________________________________  If you are age 75 or older, your body mass index should be between 23-30. Your Body mass index is 32.48 kg/m. If this is out of the aforementioned range listed,  please consider follow up with your Primary Care Provider.  If you are age 63 or younger, your body mass index should be between 19-25. Your Body mass index is 32.48 kg/m. If this is out of the aformentioned range listed, please consider follow up with your Primary Care Provider.   ________________________________________________________  The [facility] providers would like to encourage you to use MYCHART to communicate with providers for non-urgent requests or questions.  Due to long hold times on the telephone, sending your provider a message by [facility] may be a faster and more efficient way to get a response.  Please allow 48 business hours for a response.  Please remember that this is for non-urgent requests.  _______________________________________________________  [facility] is using a team-based approach to care.  Your team is made up of your doctor and two to three APPS. Our APPS (Nurse Practitioners and Physician Assistants) work with your physician to ensure care continuity for you. They are fully qualified to address your health concerns and develop a treatment plan. They communicate directly with your gastroenterologist to care for you. Seeing the Advanced Practice Practitioners on your physician's team can help you by facilitating care more promptly, often allowing for earlier appointments, access to diagnostic testing, procedures, and other specialty referrals.

## 2024-02-11 NOTE — Progress Notes (Signed)
 [facility] History and Physical   Primary Care Physician:  Gayl Males, MD   Reason for Procedure:  GERD, dysphagia, hoarseness/possible LPR, family history of Barrett's esophagus  Plan:    Upper endoscopy with possible dilation   The patient was provided an opportunity to ask questions and all were answered. The patient agreed with the plan.   HPI: [name] is a 53 y.o. female undergoing upper endoscopy with possible dilation for investigation of GERD, dysphagia, hoarseness, possible LPR and a family history of Barrett's esophagus.  Patient underwent gastric bypass in 2022.  Subsequently discontinued all GERD meds.  Since then has experienced intermittent hoarseness and an sensation of needing to clear her throat.  No abnormalities on ENT scope 2023.  Describes a sensation of food sticking in her throat.  Reports that her father had Barrett's esophagus.   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   Meniere's disease 11/06/2023   Menopausal and female climacteric states 11/06/2023   Migraine with vertigo 01/20/2012   Morbid obesity (HCC) 11/06/2012   Neck pain 09/12/2023   Obstructive sleep apnea syndrome 11/06/2023   PONV (postoperative nausea and vomiting)    Prediabetes    Pure hypercholesterolemia 11/06/2023   Recurrent major depression in full remission 11/06/2023   Reduced libido 11/06/2023   Seasonal  allergies 11/06/2023   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   Sleep apnea    Tinnitus 01/20/2012   Vertigo 01/20/2012   Vitamin D deficiency 11/06/2023    Past Surgical History:  Procedure Laterality Date   BREAST EXCISIONAL BIOPSY Left 2001   benign   CESAREAN SECTION N/A 2004 2010   x2   CHOLECYSTECTOMY  2006   DILATION AND EVACUATION N/A 10/14/2014   Procedure: DILATATION AND EVACUATION;  Surgeon: Alm Cook, MD;  Location: [facility];  Service: Gynecology;  Laterality: N/A;   GASTRIC ROUX-EN-Y N/A 01/01/2021   Procedure: LAPAROSCOPIC ROUX-EN-Y GASTRIC BYPASS WITH UPPER ENDOSCOPY;  Surgeon: Tanda Locus, MD;  Location: [facility];  Service: General;  Laterality: N/A;   NASAL SEPTUM SURGERY Bilateral 03/2006   UPPER GASTROINTESTINAL ENDOSCOPY     vein ablasion  11/2023   vein ablasion  12/2023   WISDOM TOOTH EXTRACTION      Prior to Admission medications   Medication Sig Start Date End Date Taking? Authorizing Provider  acebutolol  (SECTRAL ) 200 MG capsule Take 1 capsule (200 mg total) by mouth daily as needed. Patient not taking: Reported on 02/05/2024 11/21/23   Monetta Redell PARAS, MD  Calcium Carbonate (CALCIUM 500 PO) Take 500 mg by mouth 3 (three) times daily.    [provider]  clonazePAM  (KLONOPIN ) 0.5 MG tablet Take 0.5 mg by mouth daily as needed for anxiety.    [provider]  FLUoxetine (PROZAC) 10 MG capsule Take 10 mg by mouth daily.    [provider]  levonorgestrel  (MIRENA , 52 MG,) 20 MCG/DAY IUD 1 each by Intrauterine  route once. 02/11/20   [provider]  loratadine (CLARITIN) 10 MG tablet Take 10 mg by mouth daily.    [provider]  meclizine  (ANTIVERT ) 25 MG tablet Take 25 mg by mouth 3 (three) times daily as needed for dizziness.    [provider]  minoxidil (LONITEN) 2.5 MG tablet Take 2.5 mg by mouth daily.    [provider]  montelukast  (SINGULAIR) 10 MG tablet Take 10 mg by mouth daily.    [provider]  ondansetron  (ZOFRAN -ODT) 4 MG disintegrating tablet Take 1 tablet (4 mg total) by mouth every 6 (six) hours as needed for nausea or vomiting. 01/03/21   Tanda Locus, MD  pantoprazole  (PROTONIX ) 40 MG tablet Take 40 mg by mouth 2 (two) times daily. 01/13/24   [provider]  polyethylene glycol (MIRALAX  / GLYCOLAX ) 17 g packet Take 17 g by mouth daily as needed for mild constipation or moderate constipation.    [provider]  tirzepatide (ZEPBOUND) 2.5 MG/0.5ML Pen Inject 2.5 mg into the skin once a week.    [provider]    Current Outpatient Medications  Medication Sig Dispense Refill   Calcium Carbonate (CALCIUM 500 PO) Take 500 mg by mouth 3 (three) times daily.     FLUoxetine (PROZAC) 10 MG capsule Take 10 mg by mouth daily.     levonorgestrel  (MIRENA , 52 MG,) 20 MCG/DAY IUD 1 each by Intrauterine route once.     minoxidil (LONITEN) 2.5 MG tablet Take 2.5 mg by mouth daily.     montelukast (SINGULAIR) 10 MG tablet Take 10 mg by mouth daily.     ondansetron  (ZOFRAN -ODT) 4 MG disintegrating tablet Take 1 tablet (4 mg total) by mouth every 6 (six) hours as needed for nausea or vomiting. 20 tablet 0   pantoprazole  (PROTONIX ) 40 MG tablet Take 40 mg by mouth 2 (two) times daily.     polyethylene glycol (MIRALAX  / GLYCOLAX ) 17 g packet Take 17 g by mouth daily as needed for mild constipation or moderate constipation.     rizatriptan (MAXALT) 10 MG tablet Take 10 mg by mouth as needed.     acebutolol  (SECTRAL ) 200 MG capsule Take 1 capsule (200 mg total) by mouth daily as needed. (Patient not taking: Reported on 02/05/2024) 30 capsule 0   clonazePAM  (KLONOPIN ) 0.5 MG tablet Take 0.5 mg by mouth daily as needed for anxiety.     doxycycline (PERIOSTAT) 20 MG tablet Take 20 mg by mouth 2 (two) times daily. (Patient not taking: Reported on 02/13/2024)     hydrochlorothiazide (HYDRODIURIL)  12.5 MG tablet Take 12.5 mg by mouth daily. (Patient not taking: Reported on 02/13/2024)     loratadine (CLARITIN) 10 MG tablet Take 10 mg by mouth daily.     meclizine  (ANTIVERT ) 25 MG tablet Take 25 mg by mouth 3 (three) times daily as needed for dizziness.     sucralfate (CARAFATE) 1 g tablet Take 1 g by mouth 4 (four) times daily. (Patient not taking: Reported on 02/13/2024)     tirzepatide (ZEPBOUND) 2.5 MG/0.5ML Pen Inject 2.5 mg into the skin once a week.     Current Facility-Administered Medications  Medication Dose Route Frequency Provider Last Rate Last Admin   0.9 %  sodium chloride  infusion  500 mL Intravenous Once Deja Pisarski M, MD        Allergies as of 02/13/2024 - Review Complete 02/13/2024  Allergen Reaction Noted   Shellfish allergy Hives 10/14/2014  Family History  Problem Relation Age of Onset   Hypertension Mother    Barrett's esophagus Father    BRCA 1/2 Maternal Grandmother    Breast cancer Maternal Grandmother    Prostate cancer Maternal Grandfather    Lung cancer Paternal Grandmother    Colon cancer Neg Hx    Esophageal cancer Neg Hx    Rectal cancer Neg Hx    Stomach cancer Neg Hx     Social History   Socioeconomic History   Marital status: Married    Spouse name: Not on file   Number of children: Not on file   Years of education: Not on file   Highest education level: Not on file  Occupational History   Not on file  Tobacco Use   Smoking status: Never   Smokeless tobacco: Never  Vaping Use   Vaping status: Never Used  Substance and Sexual Activity   Alcohol  use: Not Currently   Drug use: Never   Sexual activity: Yes    Comment: husband had vasectomy  Other Topics Concern   Not on file  Social History Narrative   Not on file   Social Drivers of Health   Financial Resource Strain: Not on file  Food Insecurity: Low Risk  (08/04/2023)   Received from [facility]   Hunger Vital Sign    Within the past 12 months, you worried that  your food would run out before you got money to buy more: Never true    Within the past 12 months, the food you bought just didn't last and you didn't have money to get more. : Never true  Transportation Needs: No Transportation Needs (08/04/2023)   Received from [facility]    In the past 12 months, has lack of reliable transportation kept you from medical appointments, meetings, work or from getting things needed for daily living? : No  Physical Activity: Not on file  Stress: Not on file  Social Connections: Unknown (08/07/2021)   [facility] from [facility]   Social Network    Social Network: Not on file  Intimate Partner Violence: Unknown (06/29/2021)   Received from [facility]   HITS    Physically Hurt: Not on file    Insult or Talk Down To: Not on file    Threaten Physical Harm: Not on file    Scream or Curse: Not on file    Review of Systems:  All other review of systems negative except as mentioned in the HPI.  Physical Exam: Vital signs BP 116/65   Pulse 74   Temp 97.9 F (36.6 C) (Temporal)   Resp 12   Ht 5' 3 (1.6 m)   Wt 183 lb (83 kg)   SpO2 100%   BMI 32.42 kg/m   General:   Alert,  Well-developed, well-nourished, pleasant and cooperative in NAD Airway:  Mallampati 3 Lungs:  Clear throughout to auscultation.   Heart:  Regular rate and rhythm; no murmurs, clicks, rubs,  or gallops. Abdomen:  Soft, nontender and nondistended. Normal bowel sounds.   Neuro/Psych:  Normal mood and affect. A and O x 3  Inocente Hausen, MD [facility]

## 2024-02-13 ENCOUNTER — Encounter: Payer: Self-pay | Admitting: Pediatrics

## 2024-02-13 ENCOUNTER — Ambulatory Visit (AMBULATORY_SURGERY_CENTER): Admitting: Pediatrics

## 2024-02-13 VITALS — BP 111/64 | HR 67 | Temp 97.9°F | Resp 11 | Ht 63.0 in | Wt 183.0 lb

## 2024-02-13 DIAGNOSIS — R1310 Dysphagia, unspecified: Secondary | ICD-10-CM | POA: Diagnosis not present

## 2024-02-13 DIAGNOSIS — K229 Disease of esophagus, unspecified: Secondary | ICD-10-CM | POA: Diagnosis not present

## 2024-02-13 DIAGNOSIS — K449 Diaphragmatic hernia without obstruction or gangrene: Secondary | ICD-10-CM | POA: Diagnosis not present

## 2024-02-13 DIAGNOSIS — K2289 Other specified disease of esophagus: Secondary | ICD-10-CM

## 2024-02-13 MED ORDER — SODIUM CHLORIDE 0.9 % IV SOLN: 500.0000 mL | Freq: Once | INTRAVENOUS | Status: DC

## 2024-02-13 NOTE — Patient Instructions (Addendum)
 Resume previous diet and medications. Awaiting pathology results. Return to GI office on January 13th at 9:10am for follow up.  YOU HAD AN ENDOSCOPIC PROCEDURE TODAY AT THE  ENDOSCOPY CENTER:   Refer to the procedure report that was given to you for any specific questions about what was found during the examination.  If the procedure report does not answer your questions, please call your gastroenterologist to clarify.  If you requested that your care partner not be given the details of your procedure findings, then the procedure report has been included in a sealed envelope for you to review at your convenience later.  YOU SHOULD EXPECT: Some feelings of bloating in the abdomen. Passage of more gas than usual.  Walking can help get rid of the air that was put into your GI tract during the procedure and reduce the bloating. If you had a lower endoscopy (such as a colonoscopy or flexible sigmoidoscopy) you may notice spotting of blood in your stool or on the toilet paper. If you underwent a bowel prep for your procedure, you may not have a normal bowel movement for a few days.  Please Note:  You might notice some irritation and congestion in your nose or some drainage.  This is from the oxygen used during your procedure.  There is no need for concern and it should clear up in a day or so.  SYMPTOMS TO REPORT IMMEDIATELY:  Following upper endoscopy (EGD)  Vomiting of blood or coffee ground material  New chest pain or pain under the shoulder blades  Painful or persistently difficult swallowing  New shortness of breath  Fever of 100F or higher  Black, tarry-looking stools  For urgent or emergent issues, a gastroenterologist can be reached at any hour by calling [phone]. Do not use MyChart messaging for urgent concerns.    DIET:  We do recommend a small meal at first, but then you may proceed to your regular diet.  Drink plenty of fluids but you should avoid alcoholic beverages for 24  hours.  ACTIVITY:  You should plan to take it easy for the rest of today and you should NOT DRIVE or use heavy machinery until tomorrow (because of the sedation medicines used during the test).    FOLLOW UP: Our staff will call the number listed on your records the next business day following your procedure.  We will call around 7:15- 8:00 am to check on you and address any questions or concerns that you may have regarding the information given to you following your procedure. If we do not reach you, we will leave a message.     If any biopsies were taken you will be contacted by phone or by letter within the next 1-3 weeks.  Please call us  at [phone] if you have not heard about the biopsies in 3 weeks.    SIGNATURES/CONFIDENTIALITY: You and/or your care partner have signed paperwork which will be entered into your electronic medical record.  These signatures attest to the fact that that the information above on your After Visit Summary has been reviewed and is understood.  Full responsibility of the confidentiality of this discharge information lies with you and/or your care-partner.

## 2024-02-13 NOTE — Progress Notes (Signed)
To pacu, Vss.Report to Rn.tb

## 2024-02-13 NOTE — Op Note (Addendum)
 [facility] Patient Name: [name] Procedure Date: 02/13/2024 8:24 AM MRN: [id number] Endoscopist: Inocente Hausen , MD, [id number] Age: 53 Referring MD:  Date of Birth: December 14, 1970 Gender: Female Account #: [id number] Procedure:                Upper GI endoscopy Indications:              Dysphagia, History of Roux-en-Y gastric bypass,                            Barium esophagram with mild esophageal dysmotility,                            Family history of Barrett's esophagus?father Medicines:                Monitored Anesthesia Care Procedure:                Pre-Anesthesia Assessment:                           - Prior to the procedure, a History and Physical                            was performed, and patient medications and                            allergies were reviewed. The patient's tolerance of                            previous anesthesia was also reviewed. The risks                            and benefits of the procedure and the sedation                            options and risks were discussed with the patient.                            All questions were answered, and informed consent                            was obtained. Prior Anticoagulants: The patient has                            taken no anticoagulant or antiplatelet agents. ASA                            Grade Assessment: II - A patient with mild systemic                            disease. After reviewing the risks and benefits,                            the patient was deemed in satisfactory condition to  undergo the procedure.                           After obtaining informed consent, the endoscope was                            passed under direct vision. Throughout the                            procedure, the patient's blood pressure, pulse, and                            oxygen saturations were monitored continuously. The                            Olympus  Scope [id number] was introduced through the                            mouth, and advanced to the second part of duodenum.                            The upper GI endoscopy was accomplished without                            difficulty. The patient tolerated the procedure                            well. Scope In: Scope Out: Findings:                 No endoscopic abnormality was evident in the                            esophagus to explain the patient's complaint of                            dysphagia. It was decided, however, to proceed with                            dilation of the entire esophagus. A guidewire was                            placed and the scope was withdrawn. Dilation was                            performed with a Savary dilator with no resistance                            at 17 mm. Biopsies were obtained from the proximal                            and distal esophagus with cold forceps for  histology for evaluation of eosinophilic                            esophagitis.                           Evidence of a gastric bypass was found. A gastric                            pouch was found. The gastrojejunal anastomosis was                            characterized by healthy appearing mucosa. Both                            jejunal limbs were examined and appeared normal.                           A small hiatal hernia was present. Complications:            No immediate complications. Estimated blood loss:                            Minimal. Estimated Blood Loss:     Estimated blood loss was minimal. Impression:               - No endoscopic esophageal abnormality to explain                            patient's dysphagia. Esophagus dilated. Dilated.                           - Gastric bypass. Gastrojejunal anastomosis                            characterized by healthy appearing mucosa.                           - Small hiatal hernia.                            - Biopsies were taken with a cold forceps for                            evaluation of eosinophilic esophagitis. Recommendation:           - Discharge patient to home (ambulatory).                           - Await pathology results.                           - The findings and recommendations were discussed                            with the patient's family.                           -  Return to GI clinic in 6-8 weeks with Dr. Suzann                            or APP.                           - Patient has a contact number available for                            emergencies. The signs and symptoms of potential                            delayed complications were discussed with the                            patient. Return to normal activities tomorrow.                            Written discharge instructions were provided to the                            patient. Inocente Suzann, MD 02/13/2024 8:59:06 AM This report has been signed electronically.

## 2024-02-13 NOTE — Progress Notes (Signed)
 Pt's states no medical or surgical changes since previsit or office visit.

## 2024-02-16 NOTE — Telephone Encounter (Signed)
  Follow up Call-     02/13/2024    7:49 AM  Call back number  Post procedure Call Back phone  # [phone]  Permission to leave phone message Yes     Patient questions:  Do you have a fever, pain , or abdominal swelling? Yes.   Pain Score  2 *  Have you tolerated food without any problems? Yes.    Have you been able to return to your normal activities? Yes.    Do you have any questions about your discharge instructions: Diet   No. Medications  No. Follow up visit  No.  Do you have questions or concerns about your Care? No.  Actions: * If pain score is 4 or above: No action needed, pain <4.

## 2024-02-18 LAB — SURGICAL PATHOLOGY

## 2024-02-21 ENCOUNTER — Ambulatory Visit: Payer: Self-pay | Admitting: Pediatrics

## 2024-02-25 DIAGNOSIS — G4733 Obstructive sleep apnea (adult) (pediatric): Secondary | ICD-10-CM | POA: Diagnosis not present

## 2024-03-02 DIAGNOSIS — Z23 Encounter for immunization: Secondary | ICD-10-CM | POA: Diagnosis not present

## 2024-04-04 NOTE — Progress Notes (Unsigned)
 "  [facility] Return Visit   Referring Provider Gayl Males, MD [address] [name],  [place] [address]  Primary Care Provider Gayl Males, MD  Patient Profile: [name] is a 54 y.o. female who returns to the [facility] for follow-up of the problem(s) noted below.  Problem List: GERD, LPR, Throat clearing Dysphagia History of gastric bypass Family history of Barrett's esophagus-father IBS-C History of adenomatous colon polyps  History of Present Illness    Discussed the use of AI scribe software for clinical note transcription with the patient, who gave verbal consent to proceed.  History of Present Illness [name] is a 54 year old woman with a past medical history noteworthy for OSA, gestational diabetes, Mnire's disease, sensorineural hearing loss, vertigo, tinnitus, obesity status post gastric bypass, HLD who returns to the gastroenterology office for gastrointestinal issues as outlined below  Current GI Meds  Pantoprazole  40 mg p.o. twice daily Pepcid PRN Miralax  17g po PRN  Interval History   Throat Clearing and Laryngopharyngeal Symptoms: - Recent upper endoscopy showed no eosinophilic esophagitis, no ulceration, and unremarkable biopsies - Prior ENT evaluation 2022 unremarkable  - Not experiencing classic GERD symptoms -no pyrosis, regurgitation, heartburn -May be improved on PPI - Symptoms of dysphagia have improved since EGD with Savary dilation to 17 mm  - Persistent daily throat clearing with mild hoarseness - Symptoms most noticeable in the morning with frequent need to clear throat - No nocturnal cough, nighttime symptoms, or dysphagia - Symptoms not currently affecting quality of life and were worse prior to recent upper endoscopy - Denies postnasal drip  Reflux Management and Medication Response: - Protonix  40 mg twice daily has reduced throat clearing but symptoms persist -has not been  timing medications before meals - Uses Pepcid only occasionally - Does not regularly use Tums, Gaviscon, or Reflux Gourmet - Takes daily Claritin for allergies - Stopping Singulair and changing Zepbound did not affect throat symptoms - Previously trialed Nexium and omeprazole  Altered Bowel Habits: - Constipation with bowel movements approximately every three days, large but not hard - Intermittent diarrhea attributed to gastric bypass - Uses Miralax  17 g as needed and rarely uses Smooth Move tea - No identified dietary triggers or helpful foods; believes fiber intake is low - Discussed use of daily low-dose laxative as well as difference between osmotic and stimulant laxatives  Post-Gastric Bypass Status: - Underwent gastric bypass three years ago with 86-pound weight loss - Weight now stable with improved overall health - GLP-1 medication use has cut down on cravings but not induced weight loss  Colorectal Polyp Surveillance: - Colonoscopy in 2022 - 3 ascending colon polyps removed;  Per APP note 1 TA, 1 inflammatory polyp - recall 7 years - Father recently had three polyps removed on colonoscopy; further details pending   GI Review of Symptoms Significant for constipation, diarrhea, throat clearing. Otherwise negative.  General Review of Systems  Review of systems is significant for the pertinent positives and negatives as listed per the HPI.  Full ROS is otherwise negative.  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   Meniere's disease 11/06/2023   Menopausal and female climacteric states 11/06/2023   Migraine with vertigo 01/20/2012   Morbid obesity (HCC) 11/06/2012   Neck pain 09/12/2023   Obstructive sleep apnea syndrome 11/06/2023   PONV (postoperative nausea and vomiting)    Prediabetes    Pure hypercholesterolemia 11/06/2023   Recurrent major depression in full remission 11/06/2023   Reduced libido 11/06/2023   Seasonal allergies 11/06/2023   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   Sleep apnea    Tinnitus 01/20/2012   Vertigo 01/20/2012   Vitamin D deficiency 11/06/2023     Past Surgical History   Past Surgical History:  Procedure Laterality Date   BREAST EXCISIONAL BIOPSY Left 2001   benign   CESAREAN SECTION N/A 2004 2010   x2   CHOLECYSTECTOMY  2006   DILATION AND EVACUATION N/A 10/14/2014   Procedure: DILATATION AND EVACUATION;  Surgeon: Alm Cook, MD;  Location: [facility];  Service: Gynecology;  Laterality: N/A;   GASTRIC ROUX-EN-Y N/A 01/01/2021   Procedure: LAPAROSCOPIC ROUX-EN-Y GASTRIC BYPASS WITH UPPER ENDOSCOPY;  Surgeon: Tanda Locus, MD;  Location: [facility];  Service: General;  Laterality: N/A;   NASAL SEPTUM SURGERY Bilateral 03/2006   UPPER GASTROINTESTINAL ENDOSCOPY     vein ablasion  11/2023   vein ablasion  12/2023   WISDOM TOOTH EXTRACTION       Allergies and Medications   Allergies[1]  Current Outpatient Medications  Medication Instructions   acebutolol  (SECTRAL ) 200 mg, Oral, Daily PRN   Calcium Carbonate (CALCIUM 500 PO) 500 mg, 3 times daily   clonazePAM  (KLONOPIN ) 0.5 mg, Daily PRN   doxycycline (PERIOSTAT) 20 mg, 2 times daily   FLUoxetine (PROZAC) 10 mg, Daily   hydrochlorothiazide (HYDRODIURIL) 12.5 mg, Daily   levonorgestrel  (MIRENA , 52 MG,) 20 MCG/DAY IUD 1 each,   Once   loratadine (CLARITIN) 10 mg, Daily   meclizine  (ANTIVERT ) 25 mg, 3 times daily PRN   minoxidil (LONITEN) 2.5 mg, Daily   montelukast (SINGULAIR) 10 mg, Daily   ondansetron  (ZOFRAN -ODT) 4 mg, Oral, Every 6 hours PRN   pantoprazole  (PROTONIX ) 40 mg, 2 times daily   polyethylene glycol (MIRALAX  / GLYCOLAX ) 17 g, Daily PRN   rizatriptan (MAXALT) 10 mg, As needed   sucralfate (CARAFATE) 1 g, 4 times daily   Zepbound 2.5 mg, Weekly    Family His   Family History  Problem Relation Age of Onset   Hypertension Mother    Barrett's esophagus Father    BRCA 1/2 Maternal Grandmother    Breast cancer Maternal Grandmother    Prostate cancer Maternal Grandfather    Lung cancer Paternal Grandmother    Colon cancer Neg Hx    Esophageal cancer Neg Hx    Rectal cancer Neg Hx    Stomach cancer Neg Hx     Social History   Social History[2] Malik reports that she has never smoked. She has never used smokeless tobacco. She reports that she does not currently use alcohol . She reports that she does not use drugs.  Vital Signs and Physical Examination   Vitals:   04/06/24 0858  BP: 104/62  Pulse: 74   Body mass index is 32.48 kg/m. Weight: 183 lb 6 oz (83.2 kg)  General: Well developed, well nourished, no  acute distress Head: Normocephalic and atraumatic Eyes: Sclerae anicteric, EOMI Lungs: Clear throughout to auscultation Heart: Regular rate and rhythm; No murmurs, rubs or bruits Abdomen: Soft, non tender and non distended. No masses, hepatosplenomegaly or hernias noted. Normal Bowel sounds Rectal: Deferred Musculoskeletal: Symmetrical with no gross deformities    Review of Data   The following data was reviewed at the time of this encounter:   Laboratory Studies      Latest Ref Rng & Units 01/03/2021    4:26 AM 01/02/2021    3:56 AM 01/01/2021   10:08 AM  CBC  WBC 4.0 - 10.5 K/uL 9.3  10.5    Hemoglobin 12.0 - 15.0 g/dL 87.2  86.9  85.4   Hematocrit 36.0 - 46.0 %  40.1  39.7  43.7   Platelets 150 - 400 K/uL 188  201      No results found for: LIPASE    Latest Ref Rng & Units 01/02/2021    3:56 AM 12/27/2020   11:08 AM 04/30/2008    5:14 PM  CMP  Glucose 70 - 99 mg/dL 812  896  96   BUN 6 - 20 mg/dL 8  14  15    Creatinine 0.44 - 1.00 mg/dL 9.41  9.18  9.20   Sodium 135 - 145 mmol/L 136  141  140   Potassium 3.5 - 5.1 mmol/L 4.0  4.4  4.0   Chloride 98 - 111 mmol/L 106  104  110   CO2 22 - 32 mmol/L 23  28  23    Calcium 8.9 - 10.3 mg/dL 8.4  9.4  8.8   Total Protein 6.5 - 8.1 g/dL 7.0  7.9  6.1   Total Bilirubin 0.3 - 1.2 mg/dL 0.8  0.7  0.4   Alkaline Phos 38 - 126 U/L 45  56  98   AST 15 - 41 U/L 32  33  33   ALT 0 - 44 U/L 37  38  35      Imaging Studies  Upper GI series 01/15/2024 Mild dysmotility seen in 1/3 swallows. Otherwise normal upper GI series post gastric bypass.  CTAP 08/17/2021 1. Nonobstructing 2 mm left renal calculus. 2. Moderate retained stool throughout the colon consistent with constipation. No obstruction or ileus.  Upper GI series 08/16/2020 Normal    GI Procedures and Studies  EGD 02/13/2024 - No endoscopic esophageal abnormality to explain patient's dysphagia. Esophagus dilated. Dilated with Savary to 17 mm. - Gastric bypass. Gastrojejunal anastomosis characterized by healthy appearing mucosa.  - Small hiatal hernia.  - Biopsies were taken with a cold forceps for evaluation of eosinophilic esophagitis.  Path: Upper and lower esophagus with reactive changes  Colonoscopy 08/2020 08/2020 colonoscopy good prep 2 mm polyp ascending colon, 4 mm polyp ascending, recall 3 years 1 TA polyp, 1 inflammatory polyp Given presence of only 1 adenomatous polyp recommend recall at 7 years  Clinical Impression  It is my clinical impression that Ms. Bigos is a 54 y.o. female with;  GERD, LPR, Throat clearing Dysphagia History of gastric bypass Family history of Barrett's esophagus-father IBS-C History of adenomatous  colon polyps  Naje returns to the gastroenterology office for follow-up of multiple gastrointestinal issues as outlined above.  She has had ongoing symptoms attributed to GERD/LPR.  Reports that she has been on proton pump inhibitor therapy for many years including Nexium and omeprazole and most recently pantoprazole .  She had hoped that her gastric bypass surgery would resolve her reflux issues.  Over the last year she endorsed symptoms of LPR, dysphagia and throat clearing.  She notes that the symptoms have improved with Protonix  40 mg p.o. twice daily in conjunction with Pepcid as needed although throat clearing persists and is problematic.  Dysphagia resolved status post recent EGD with dilation to 17 mm.  Her throat clearing has not changed despite antacid therapy.  Noteworthy that she is not timing her PPI before meals and we discussed the importance of this.  She was previously evaluated by ENT in 2022 and had a normal exam.  Has used Claritin and Singulair without changes.  At this juncture it is unclear if her throat clearing is truly related to acid reflux or not.  As she is generally had benefit from antacid therapy she wishes to continue in this vein.  Does not feel the need to pursue additional testing such as pH and impedance.  She continues to experience intermittent symptoms of constipation and periodic diarrhea but is constipation predominant.  Defecates once every 2 to 3 days.  She is taking MiraLAX  on a as needed basis.  She has used smooth move tea in the past but on on a regular basis.  Bowel movements are not described as being excessively hard.  We reviewed the difference between osmotic and stimulant laxatives.  Suggested the use of a stimulant laxative such as senna or smooth move.  Also discussed importance of fiber in diet.  Colonoscopy in 2022 at [facility] disclosed 3 polyps 1 of which was a tubular adenoma and another 1 in inflammatory polyp.  Based on this finding we have  recommended a 7-year follow-up colonoscopy.  Today, Arline informs me that her father had a recent colonoscopy with polyps identified.  Discussed that if he has precancerous polyps she can share the information with us  to determine if there would be any need for a shortened colonoscopy interval in her case.  Plan  Continue Protonix  40 mg p.o. twice daily and time 20 to 30 minutes before meals Continue Pepcid as needed Reviewed use of reflux Gourmet If symptoms persist may consider referral to allergy for further investigation of throat clearing GERD diet and lifestyle modification Handout provided regarding high-fiber diet for constipation Consider use of stimulant laxative such as senna or smooth move tea Reviewed use of low-dose laxative such as MiraLAX  1/4-1/2 cap daily Next recall colonoscopy 2029 -she will update our office if there are findings on her father's colonoscopy that may warrant shortening her colonoscopy surveillance interval such as advanced adenoma  Planned Follow Up Return in about 6 months (around 10/04/2024), or sooner if symptoms worsen or fail to improve.  The patient or caregiver verbalized understanding of the material covered, with no barriers to understanding. All questions were answered. Patient or caregiver is agreeable with the plan outlined above.    It was a pleasure to see Dreyah.  If you have any questions or concerns regarding this evaluation, do not hesitate to contact me.  Inocente Hausen, MD [facility]   I spent total of  35 minutes in both face-to-face (20 minutes interview) and non-face-to-face (15 minutes chart review, care coordination, documentation)  activities, excluding procedures performed, for the visit on the date of this encounter.     [1]  Allergies Allergen Reactions   Shellfish Allergy Hives  [2]  Social History Tobacco Use   Smoking status: Never   Smokeless tobacco: Never  Vaping Use   Vaping status: Never Used   Substance Use Topics  Alcohol  use: Not Currently   Drug use: Never   "

## 2024-04-06 ENCOUNTER — Ambulatory Visit: Admitting: Pediatrics

## 2024-04-06 ENCOUNTER — Encounter: Payer: Self-pay | Admitting: Pediatrics

## 2024-04-06 VITALS — BP 104/62 | HR 74 | Ht 63.0 in | Wt 183.4 lb

## 2024-04-06 DIAGNOSIS — Z860101 Personal history of adenomatous and serrated colon polyps: Secondary | ICD-10-CM

## 2024-04-06 DIAGNOSIS — R1310 Dysphagia, unspecified: Secondary | ICD-10-CM

## 2024-04-06 DIAGNOSIS — Z9884 Bariatric surgery status: Secondary | ICD-10-CM

## 2024-04-06 DIAGNOSIS — K219 Gastro-esophageal reflux disease without esophagitis: Secondary | ICD-10-CM | POA: Diagnosis not present

## 2024-04-06 DIAGNOSIS — R194 Change in bowel habit: Secondary | ICD-10-CM

## 2024-04-06 NOTE — Patient Instructions (Signed)
 Follow up in 6-12 months.  Thank you for entrusting me with your care and for choosing [facility], Dr. Inocente Hausen  _______________________________________________________  If your blood pressure at your visit was 140/90 or greater, please contact your primary care physician to follow up on this.  _______________________________________________________  If you are age 52 or older, your body mass index should be between 23-30. Your Body mass index is 32.48 kg/m. If this is out of the aforementioned range listed, please consider follow up with your Primary Care Provider.  If you are age 82 or younger, your body mass index should be between 19-25. Your Body mass index is 32.48 kg/m. If this is out of the aformentioned range listed, please consider follow up with your Primary Care Provider.   ________________________________________________________  The [facility] [facility] providers would like to encourage you to use MYCHART to communicate with providers for non-urgent requests or questions.  Due to long hold times on the telephone, sending your provider a message by [facility] may be a faster and more efficient way to get a response.  Please allow 48 business hours for a response.  Please remember that this is for non-urgent requests.  _______________________________________________________  [facility] is using a team-based approach to care.  Your team is made up of your doctor and two to three APPS. Our APPS (Nurse Practitioners and Physician Assistants) work with your physician to ensure care continuity for you. They are fully qualified to address your health concerns and develop a treatment plan. They communicate directly with your gastroenterologist to care for you. Seeing the Advanced Practice Practitioners on your physician's team can help you by facilitating care more promptly, often allowing for earlier appointments, access to diagnostic testing, procedures, and other specialty  referrals.
